# Patient Record
Sex: Female | Born: 1950
Health system: Southern US, Community
[De-identification: ages and names within clinical notes are randomized; demographics above are authoritative.]

## PROBLEM LIST (undated history)

## (undated) DIAGNOSIS — E119 Type 2 diabetes mellitus without complications: Secondary | ICD-10-CM

## (undated) DIAGNOSIS — R112 Nausea with vomiting, unspecified: Secondary | ICD-10-CM

## (undated) DIAGNOSIS — Z9889 Other specified postprocedural states: Secondary | ICD-10-CM

## (undated) DIAGNOSIS — M1990 Unspecified osteoarthritis, unspecified site: Secondary | ICD-10-CM

## (undated) HISTORY — DX: Type 2 diabetes mellitus without complications: E11.9

## (undated) HISTORY — PX: APPENDECTOMY: SHX54

## (undated) HISTORY — PX: TUBAL LIGATION: SHX77

## (undated) HISTORY — PX: LEEP: SHX91

## (undated) HISTORY — PX: OTHER SURGICAL HISTORY: SHX169

---

## 1993-03-28 HISTORY — PX: SPINE SURGERY: SHX786

## 2003-03-20 ENCOUNTER — Encounter (INDEPENDENT_AMBULATORY_CARE_PROVIDER_SITE_OTHER): Payer: Self-pay | Admitting: Specialist

## 2003-03-20 ENCOUNTER — Ambulatory Visit (HOSPITAL_COMMUNITY): Admission: RE | Admit: 2003-03-20 | Discharge: 2003-03-20 | Payer: Self-pay | Admitting: Gastroenterology

## 2005-01-26 ENCOUNTER — Other Ambulatory Visit: Admission: RE | Admit: 2005-01-26 | Discharge: 2005-01-26 | Payer: Self-pay | Admitting: Internal Medicine

## 2006-03-29 ENCOUNTER — Other Ambulatory Visit: Admission: RE | Admit: 2006-03-29 | Discharge: 2006-03-29 | Payer: Self-pay | Admitting: Internal Medicine

## 2007-03-19 ENCOUNTER — Other Ambulatory Visit: Admission: RE | Admit: 2007-03-19 | Discharge: 2007-03-19 | Payer: Self-pay | Admitting: Internal Medicine

## 2010-04-12 ENCOUNTER — Other Ambulatory Visit
Admission: RE | Admit: 2010-04-12 | Discharge: 2010-04-12 | Payer: Self-pay | Source: Home / Self Care | Admitting: Internal Medicine

## 2010-04-12 ENCOUNTER — Other Ambulatory Visit: Payer: Self-pay | Admitting: Internal Medicine

## 2010-08-13 NOTE — Op Note (Signed)
NAME:  Debra Norris, Debra Norris                       ACCOUNT NO.:  [id number]   MEDICAL RECORD NO.:  [id number]                   PATIENT TYPE:  AMB   LOCATION:  ENDO                                 FACILITY:  [facility]   PHYSICIAN:  Danise Edge, M.D.                DATE OF BIRTH:  1951-01-17   DATE OF PROCEDURE:  03/20/2003  DATE OF DISCHARGE:                                 OPERATIVE REPORT   PROCEDURE:  Colonoscopy and polypectomy.   PROCEDURE INDICATION:  Ms. Atarah Cadogan is a 60 year old female born  [birth year].  Ms. Larkin underwent her health maintenance flexible  proctosigmoidoscopy, and a sigmoid polyp was discovered by Theressa Millard,  M.D.   ENDOSCOPIST:  Danise Edge, M.D.   PREMEDICATION:  Versed 10 mg, fentanyl 100 mcg.   DESCRIPTION OF PROCEDURE:  After obtaining informed consent, Ms. Buehrer  was placed in the left lateral decubitus position.  I administered  intravenous fentanyl and intravenous Versed to achieve conscious sedation  for the procedure.  The patient's blood pressure, oxygen saturation, and  cardiac rhythm were monitored throughout the procedure and documented in the  medical record.   Anal inspection was normal.  Digital rectal exam was normal.  The Olympus  adjustable pediatric colonoscope was introduced into the rectum and with  some difficulty due to colonic loop formation, eventually advanced to the  cecum.  Colonic preparation for the exam today was excellent.   Rectum normal.   Sigmoid colon and descending colon:  At approximately 30-35 cm from the anal  verge, a 1 cm sessile polyp was removed with the electrocautery snare and  submitted for pathologic interpretation.   Splenic flexure normal.   Transverse colon normal.   Hepatic flexure normal.   Ascending colon normal.   Cecum and ileocecal valve:  A 1 mm sessile polyp was removed from the  proximal cecum with the cold biopsy forceps.   ASSESSMENT:  A diminutive polyp  was removed from the cecum with the cold  biopsy forceps and a 1 cm polyp was removed from the sigmoid colon at 30 cm  from the anal verge.  Both polyps were submitted in one bottle for  pathologic evaluation.   RECOMMENDATIONS:  Repeat colonoscopy in five years if polyp returns  neoplastic pathologically.                                               Danise Edge, M.D.    MJ/MEDQ  D:  03/20/2003  T:  03/21/2003  Job:  [id number]   cc:   Theressa Millard, M.D.  [address] [place]  [place] [address]  Fax: [phone]

## 2012-04-30 ENCOUNTER — Other Ambulatory Visit: Payer: Self-pay | Admitting: Internal Medicine

## 2012-04-30 ENCOUNTER — Other Ambulatory Visit (HOSPITAL_COMMUNITY)
Admission: RE | Admit: 2012-04-30 | Discharge: 2012-04-30 | Disposition: A | Discharge status: Home / Self Care | Payer: Managed Care, Other (non HMO) | Source: Ambulatory Visit | Attending: Internal Medicine | Admitting: Internal Medicine

## 2012-04-30 DIAGNOSIS — Z01419 Encounter for gynecological examination (general) (routine) without abnormal findings: Secondary | ICD-10-CM | POA: Insufficient documentation

## 2013-05-28 ENCOUNTER — Encounter: Payer: BC Managed Care – PPO | Attending: Nurse Practitioner

## 2013-05-28 VITALS — Ht 66.0 in | Wt 188.0 lb

## 2013-05-28 DIAGNOSIS — E119 Type 2 diabetes mellitus without complications: Secondary | ICD-10-CM | POA: Insufficient documentation

## 2013-05-28 DIAGNOSIS — Z713 Dietary counseling and surveillance: Secondary | ICD-10-CM | POA: Insufficient documentation

## 2013-06-03 NOTE — Progress Notes (Signed)
Patient was seen on 05/28/13 for the first of a series of three diabetes self-management courses at the [facility].  Current HbA1c: 12.7%  The following learning objectives were met by the patient during this class:  Describe diabetes  State some common risk factors for diabetes  Defines the role of glucose and insulin  Identifies type of diabetes and pathophysiology  Describe the relationship between diabetes and cardiovascular risk  State the members of the Healthcare Team  States the rationale for glucose monitoring  State when to test glucose  State their individual Target Range  State the importance of logging glucose readings  Describe how to interpret glucose readings  Identifies A1C target  Explain the correlation between A1c and eAG values  State symptoms and treatment of high blood glucose  State symptoms and treatment of low blood glucose  Explain proper technique for glucose testing  Identifies proper sharps disposal  Handouts given during class include:  Living Well with Diabetes book  Carb Counting and Meal Planning book  Meal Plan Card  Carbohydrate guide  Meal planning worksheet  Low Sodium Flavoring Tips  The diabetes portion plate  T6K to eAG Conversion Chart  Diabetes Medications  Diabetes Recommended Care Schedule  Support Group  Diabetes Success Plan  Core Class Satisfaction Survey  Follow-Up Plan:  Attend core 2

## 2013-06-11 ENCOUNTER — Ambulatory Visit: Payer: Managed Care, Other (non HMO)

## 2013-06-12 DIAGNOSIS — E119 Type 2 diabetes mellitus without complications: Secondary | ICD-10-CM

## 2013-06-14 NOTE — Progress Notes (Signed)

## 2013-06-19 DIAGNOSIS — E119 Type 2 diabetes mellitus without complications: Secondary | ICD-10-CM

## 2013-06-19 NOTE — Progress Notes (Signed)
Patient was seen on 06/19/2013 for the third of a series of three diabetes self-management courses at the [facility]. The following learning objectives were met by the patient during this class:    State the amount of activity recommended for healthy living   Describe activities suitable for individual needs   Identify ways to regularly incorporate activity into daily life   Identify barriers to activity and ways to over come these barriers  Identify diabetes medications being personally used and their primary action for lowering glucose and possible side effects   Describe role of stress on blood glucose and develop strategies to address psychosocial issues   Identify diabetes complications and ways to prevent them  Explain how to manage diabetes during illness   Evaluate success in meeting personal goal   Establish 2-3 goals that they will plan to diligently work on until they return for the  33-month follow-up visit  Goals:  Follow Diabetes Meal Plan as instructed  Aim for 15-30 mins of physical activity daily as tolerated  Bring food record and glucose log to your follow up visit  Your patient has established the following 4 month goals in their individualized success plan:  Increase my activity by at least 5 days a week  Be active 15 minutes or more 3 x week  Test glucose 2 x day  Your patient has identified these potential barriers to change:  Days just don't feel like it  Feel tired   Your patient has identified their diabetes self-care support plan as  [facility]

## 2013-09-30 ENCOUNTER — Encounter: Payer: BC Managed Care – PPO | Attending: Internal Medicine

## 2013-09-30 DIAGNOSIS — E118 Type 2 diabetes mellitus with unspecified complications: Secondary | ICD-10-CM | POA: Insufficient documentation

## 2013-09-30 DIAGNOSIS — Z713 Dietary counseling and surveillance: Secondary | ICD-10-CM | POA: Diagnosis not present

## 2013-09-30 NOTE — Progress Notes (Signed)
Patient was seen on 09/30/2013 for a review of the series of three diabetes self-management courses at the [facility]. The following learning objectives were met by the patient during this class:    Reviewed blood glucose monitoring and interpretation including the recommended target ranges and Hgb A1c.    Reviewed on carb counting, importance of regularly scheduled meals/snacks, and meal planning.    Reviewed the effects of physical activity on glucose levels and long-term glucose control.  Recommended goal of 150 minutes of physical activity/week.   Reviewed patient medications and discussed role of medication on blood glucose and possible side effects.   Discussed strategies to manage stress, psychosocial issues, and other obstacles to diabetes management.   Encouraged moderate weight reduction to improve glucose levels.     Reviewed short-term complications: hyper- and hypo-glycemia.  Discussed causes, symptoms, and treatment options.   Reviewed prevention, detection, and treatment of long-term complications.  Discussed the role of prolonged elevated glucose levels on body systems.  Goals:  Follow Diabetes Meal Plan as instructed  Eat 3 meals and 2 snacks, every 3-5 hrs  Limit carbohydrate intake to 45 grams carbohydrate/meal Limit carbohydrate intake to 15 grams carbohydrate/snack Add lean protein foods to meals/snacks  Monitor glucose levels as instructed by your doctor  Aim for goal of 15-30 mins of physical activity daily as tolerated  Bring food record and glucose log to your next nutrition visit   

## 2015-05-11 ENCOUNTER — Other Ambulatory Visit (HOSPITAL_COMMUNITY)
Admission: RE | Admit: 2015-05-11 | Discharge: 2015-05-11 | Disposition: A | Discharge status: Home / Self Care | Payer: BLUE CROSS/BLUE SHIELD | Source: Ambulatory Visit | Attending: Internal Medicine | Admitting: Internal Medicine

## 2015-05-11 ENCOUNTER — Other Ambulatory Visit: Payer: Self-pay | Admitting: Internal Medicine

## 2015-05-11 DIAGNOSIS — Z1389 Encounter for screening for other disorder: Secondary | ICD-10-CM | POA: Diagnosis not present

## 2015-05-11 DIAGNOSIS — E1142 Type 2 diabetes mellitus with diabetic polyneuropathy: Secondary | ICD-10-CM | POA: Diagnosis not present

## 2015-05-11 DIAGNOSIS — Z6832 Body mass index (BMI) 32.0-32.9, adult: Secondary | ICD-10-CM | POA: Diagnosis not present

## 2015-05-11 DIAGNOSIS — Z1151 Encounter for screening for human papillomavirus (HPV): Secondary | ICD-10-CM | POA: Diagnosis present

## 2015-05-11 DIAGNOSIS — Z0000 Encounter for general adult medical examination without abnormal findings: Secondary | ICD-10-CM | POA: Diagnosis not present

## 2015-05-11 DIAGNOSIS — Z124 Encounter for screening for malignant neoplasm of cervix: Secondary | ICD-10-CM | POA: Diagnosis not present

## 2015-05-11 DIAGNOSIS — Z01419 Encounter for gynecological examination (general) (routine) without abnormal findings: Secondary | ICD-10-CM | POA: Diagnosis present

## 2015-05-11 DIAGNOSIS — E785 Hyperlipidemia, unspecified: Secondary | ICD-10-CM | POA: Diagnosis not present

## 2015-05-11 DIAGNOSIS — R32 Unspecified urinary incontinence: Secondary | ICD-10-CM | POA: Diagnosis not present

## 2015-05-13 LAB — CYTOLOGY - PAP

## 2016-05-16 ENCOUNTER — Other Ambulatory Visit: Payer: Self-pay | Admitting: Internal Medicine

## 2016-05-16 DIAGNOSIS — R1030 Lower abdominal pain, unspecified: Secondary | ICD-10-CM

## 2016-05-16 DIAGNOSIS — R102 Pelvic and perineal pain: Secondary | ICD-10-CM

## 2016-05-20 ENCOUNTER — Ambulatory Visit
Admission: RE | Admit: 2016-05-20 | Discharge: 2016-05-20 | Disposition: A | Discharge status: Home / Self Care | Payer: BLUE CROSS/BLUE SHIELD | Source: Ambulatory Visit | Attending: Internal Medicine | Admitting: Internal Medicine

## 2016-05-20 DIAGNOSIS — R1030 Lower abdominal pain, unspecified: Secondary | ICD-10-CM

## 2016-05-20 DIAGNOSIS — R102 Pelvic and perineal pain: Secondary | ICD-10-CM

## 2016-07-18 ENCOUNTER — Other Ambulatory Visit: Payer: Self-pay | Admitting: Gastroenterology

## 2016-08-15 ENCOUNTER — Encounter (HOSPITAL_COMMUNITY): Payer: Self-pay | Admitting: *Deleted

## 2016-08-17 ENCOUNTER — Encounter (HOSPITAL_COMMUNITY): Payer: Self-pay | Admitting: *Deleted

## 2016-08-17 ENCOUNTER — Ambulatory Visit (HOSPITAL_COMMUNITY): Payer: Medicare Other | Admitting: Anesthesiology

## 2016-08-17 ENCOUNTER — Encounter (HOSPITAL_COMMUNITY)
Admission: RE | Disposition: A | Discharge status: Home / Self Care | Payer: Self-pay | Source: Ambulatory Visit | Attending: Gastroenterology

## 2016-08-17 ENCOUNTER — Ambulatory Visit (HOSPITAL_COMMUNITY)
Admission: RE | Admit: 2016-08-17 | Discharge: 2016-08-17 | Disposition: A | Discharge status: Home / Self Care | Payer: Medicare Other | Source: Ambulatory Visit | Attending: Gastroenterology | Admitting: Gastroenterology

## 2016-08-17 DIAGNOSIS — Z800 Family history of malignant neoplasm of digestive organs: Secondary | ICD-10-CM | POA: Diagnosis not present

## 2016-08-17 DIAGNOSIS — Z8371 Family history of colonic polyps: Secondary | ICD-10-CM | POA: Insufficient documentation

## 2016-08-17 DIAGNOSIS — Z87891 Personal history of nicotine dependence: Secondary | ICD-10-CM | POA: Insufficient documentation

## 2016-08-17 DIAGNOSIS — Z1211 Encounter for screening for malignant neoplasm of colon: Secondary | ICD-10-CM | POA: Insufficient documentation

## 2016-08-17 DIAGNOSIS — Z79899 Other long term (current) drug therapy: Secondary | ICD-10-CM | POA: Diagnosis not present

## 2016-08-17 DIAGNOSIS — Z86010 Personal history of colonic polyps: Secondary | ICD-10-CM | POA: Diagnosis not present

## 2016-08-17 DIAGNOSIS — E1142 Type 2 diabetes mellitus with diabetic polyneuropathy: Secondary | ICD-10-CM | POA: Diagnosis not present

## 2016-08-17 DIAGNOSIS — Z791 Long term (current) use of non-steroidal anti-inflammatories (NSAID): Secondary | ICD-10-CM | POA: Insufficient documentation

## 2016-08-17 DIAGNOSIS — D120 Benign neoplasm of cecum: Secondary | ICD-10-CM | POA: Insufficient documentation

## 2016-08-17 HISTORY — DX: Nausea with vomiting, unspecified: R11.2

## 2016-08-17 HISTORY — PX: COLONOSCOPY WITH PROPOFOL: SHX5780

## 2016-08-17 HISTORY — DX: Other specified postprocedural states: Z98.890

## 2016-08-17 HISTORY — DX: Unspecified osteoarthritis, unspecified site: M19.90

## 2016-08-17 LAB — GLUCOSE, CAPILLARY: GLUCOSE-CAPILLARY: 112 mg/dL — AB (ref 65–99)

## 2016-08-17 SURGERY — COLONOSCOPY WITH PROPOFOL
Anesthesia: Monitor Anesthesia Care

## 2016-08-17 MED ORDER — PROPOFOL 10 MG/ML IV BOLUS
INTRAVENOUS | Status: DC | PRN
  Administered 2016-08-17: 40 mg via INTRAVENOUS
  Administered 2016-08-17: 20 mg via INTRAVENOUS

## 2016-08-17 MED ORDER — LIDOCAINE 2% (20 MG/ML) 5 ML SYRINGE
INTRAMUSCULAR | Status: AC
  Filled 2016-08-17: qty 5

## 2016-08-17 MED ORDER — PROPOFOL 10 MG/ML IV BOLUS
INTRAVENOUS | Status: AC
  Filled 2016-08-17: qty 60

## 2016-08-17 MED ORDER — LIDOCAINE 2% (20 MG/ML) 5 ML SYRINGE
INTRAMUSCULAR | Status: DC | PRN
  Administered 2016-08-17: 80 mg via INTRAVENOUS

## 2016-08-17 MED ORDER — PROPOFOL 500 MG/50ML IV EMUL
INTRAVENOUS | Status: DC | PRN
  Administered 2016-08-17: 75 ug/kg/min via INTRAVENOUS

## 2016-08-17 MED ORDER — SODIUM CHLORIDE 0.9 % IV SOLN: INTRAVENOUS | Status: DC

## 2016-08-17 MED ORDER — ONDANSETRON HCL 4 MG/2ML IJ SOLN
INTRAMUSCULAR | Status: AC
  Filled 2016-08-17: qty 2

## 2016-08-17 MED ORDER — ONDANSETRON HCL 4 MG/2ML IJ SOLN
INTRAMUSCULAR | Status: DC | PRN
  Administered 2016-08-17: 4 mg via INTRAVENOUS

## 2016-08-17 MED ORDER — LACTATED RINGERS IV SOLN
INTRAVENOUS | Status: DC
  Administered 2016-08-17: 09:00:00 via INTRAVENOUS

## 2016-08-17 SURGICAL SUPPLY — 21 items

## 2016-08-17 NOTE — Op Note (Signed)
[facility] Patient Name: Debra Norris Procedure Date: 08/17/2016 MRN: [id number] Attending MD: Garlan Fair , MD Date of Birth: [birth year] CSN: [id number] Age: 66 Admit Type: Outpatient Procedure:                Colonoscopy Indications:              High risk colon cancer surveillance: 03/20/2003                            screening colonoscopy was performed with removal of                            a diminutive adenomatous cecal polyp and diminutive                            adenomatous sigmoid colon polyp. Normal                            surveillance colonoscopies were performed in                            January 2008 and March 2013. Providers:                Garlan Fair, MD, Carmie End, RN, Lillie Fragmin, RN, Tinnie Gens, Technician Referring MD:              Medicines:                Propofol per Anesthesia Complications:            No immediate complications. Estimated Blood Loss:     Estimated blood loss was minimal. Procedure:                Pre-Anesthesia Assessment:                           - Prior to the procedure, a History and Physical                            was performed, and patient medications and                            allergies were reviewed. The patient's tolerance of                            previous anesthesia was also reviewed. The risks                            and benefits of the procedure and the sedation                            options and risks were discussed with the patient.  All questions were answered, and informed consent                            was obtained. Prior Anticoagulants: The patient has                            taken no previous anticoagulant or antiplatelet                            agents. ASA Grade Assessment: II - A patient with                            mild systemic disease. After reviewing the risks          and benefits, the patient was deemed in                            satisfactory condition to undergo the procedure.                           After obtaining informed consent, the colonoscope                            was passed under direct vision. Throughout the                            procedure, the patient's blood pressure, pulse, and                            oxygen saturations were monitored continuously. The                            EC-[id number] ([id number]) scope was introduced through                            the anus and advanced to the the cecum, identified                            by appendiceal orifice and ileocecal valve. The                            colonoscopy was somewhat difficult due to                            significant looping. The patient tolerated the                            procedure well. The quality of the bowel                            preparation was good. The ileocecal valve, the                            appendiceal orifice and the rectum were  photographed. Scope In: 9:52:42 AM Scope Out: 10:19:39 AM Scope Withdrawal Time: 0 hours 15 minutes 8 seconds  Total Procedure Duration: 0 hours 26 minutes 57 seconds  Findings:      The perianal and digital rectal examinations were normal.      A 7 mm polyp was found in the cecum. The polyp was sessile. The polyp       was removed with a piecemeal technique using a cold snare. Resection and       retrieval were complete.      The exam was otherwise without abnormality. Impression:               - One 7 mm polyp in the cecum, removed piecemeal                            using a cold snare. Resected and retrieved.                           - The examination was otherwise normal. Moderate Sedation:      N/A- Per Anesthesia Care Recommendation:           - Patient has a contact number available for                            emergencies. The signs and symptoms of  potential                            delayed complications were discussed with the                            patient. Return to normal activities tomorrow.                            Written discharge instructions were provided to the                            patient.                           - Repeat colonoscopy in 5 years for surveillance.                           - Resume previous diet.                           - Continue present medications. Procedure Code(s):        --- Professional ---                           [phone], Colonoscopy, flexible; with removal of                            tumor(s), polyp(s), or other lesion(s) by snare                            technique Diagnosis Code(s):        --- Professional ---  Z86.010, Personal history of colonic polyps                           D12.0, Benign neoplasm of cecum CPT copyright 2016 [employer]. All rights reserved. The codes documented in this report are preliminary and upon coder review may  be revised to meet current compliance requirements. Earle Gell, MD Garlan Fair, MD 08/17/2016 10:26:11 AM This report has been signed electronically. Number of Addenda: 0

## 2016-08-17 NOTE — Transfer of Care (Signed)
Immediate Anesthesia Transfer of Care Note  Patient: Debra Norris  Procedure(s) Performed: Procedure(s): COLONOSCOPY WITH PROPOFOL (N/A)  Patient Location: PACU  Anesthesia Type:MAC  Level of Consciousness:  sedated, patient cooperative and responds to stimulation  Airway & Oxygen Therapy:Patient Spontanous Breathing and Patient connected to face mask oxgen  Post-op Assessment:  Report given to PACU RN and Post -op Vital signs reviewed and stable  Post vital signs:  Reviewed and stable  Last Vitals:  Vitals:   08/17/16 0855  BP: (!) 146/79  Pulse: 66  Resp: 11  Temp: 09.3 C    Complications: No apparent anesthesia complications

## 2016-08-17 NOTE — Anesthesia Postprocedure Evaluation (Signed)
Anesthesia Post Note  Patient: Debra Norris  Procedure(s) Performed: Procedure(s) (LRB): COLONOSCOPY WITH PROPOFOL (N/A)  Patient location during evaluation: PACU Anesthesia Type: MAC Level of consciousness: awake and alert Pain management: pain level controlled Vital Signs Assessment: post-procedure vital signs reviewed and stable Respiratory status: spontaneous breathing, nonlabored ventilation, respiratory function stable and patient connected to nasal cannula oxygen Cardiovascular status: stable and blood pressure returned to baseline Anesthetic complications: no       Last Vitals:  Vitals:   08/17/16 1030 08/17/16 1040  BP: 107/65 107/64  Pulse: 62 (!) 59  Resp: 14 13  Temp:      Last Pain:  Vitals:   08/17/16 1025  TempSrc: Oral                 Haden Suder S

## 2016-08-17 NOTE — Anesthesia Preprocedure Evaluation (Signed)
Anesthesia Evaluation  Patient identified by MRN, date of birth, ID band Patient awake    Reviewed: Allergy & Precautions, NPO status , Patient's Chart, lab work & pertinent test results  Airway Mallampati: II  TM Distance: >3 FB Neck ROM: Full    Dental no notable dental hx.    Pulmonary neg pulmonary ROS, former smoker,    Pulmonary exam normal breath sounds clear to auscultation       Cardiovascular negative cardio ROS Normal cardiovascular exam Rhythm:Regular Rate:Normal     Neuro/Psych negative neurological ROS  negative psych ROS   GI/Hepatic negative GI ROS, Neg liver ROS,   Endo/Other  negative endocrine ROSdiabetes  Renal/GU negative Renal ROS  negative genitourinary   Musculoskeletal negative musculoskeletal ROS (+)   Abdominal   Peds negative pediatric ROS (+)  Hematology negative hematology ROS (+)   Anesthesia Other Findings   Reproductive/Obstetrics negative OB ROS                             Anesthesia Physical Anesthesia Plan  ASA: II  Anesthesia Plan: MAC   Post-op Pain Management:    Induction: Intravenous  Airway Management Planned: Nasal Cannula  Additional Equipment:   Intra-op Plan:   Post-operative Plan:   Informed Consent: I have reviewed the patients History and Physical, chart, labs and discussed the procedure including the risks, benefits and alternatives for the proposed anesthesia with the patient or authorized representative who has indicated his/her understanding and acceptance.   Dental advisory given  Plan Discussed with: CRNA and Surgeon  Anesthesia Plan Comments:         Anesthesia Quick Evaluation

## 2016-08-17 NOTE — Discharge Instructions (Signed)

## 2016-08-17 NOTE — H&P (Signed)
Procedure: Surveillance colonoscopy. Normal surveillance colonoscopies were performed in March 2013 and January 2008. 03/20/2003 screening colonoscopy was performed with removal of a diminutive adenomatous cecal polyp and diminutive adenomatous sigmoid colon polyp. A brother required surgery to remove a colon polyp recently. Another brother was diagnosed with pancreatic cancer.  History: The patient is a 66 year old female born 02/12/1951. She is scheduled to undergo a repeat surveillance colonoscopy today.  Past medical history: Type 2 diabetes mellitus was diagnosed in 2014. Mild diabetic peripheral neuropathy. Stress urinary incontinence. Lumbar radiculopathy. Osteoarthritis of the hip. Tubal ligation. Lumbar laminectomy. Uterine fibroid tumor removed surgically.  Medication allergies: Demerol. Erythromycin.Xartemis.  Exam: The patient is alert and lying comfortably on the endoscopy stretcher. Abdomen soft and nontender to palpation. Lungs are clear to auscultation. Cardiac exam reveals a regular rhythm.  Plan: Proceed with surveillance colonoscopy.

## 2016-08-18 ENCOUNTER — Encounter (HOSPITAL_COMMUNITY): Payer: Self-pay | Admitting: Gastroenterology

## 2016-08-29 NOTE — Addendum Note (Signed)
Addendum  created 08/29/16 1447 by Myrtie Soman, MD   Sign clinical note

## 2016-08-29 NOTE — Anesthesia Postprocedure Evaluation (Signed)
Anesthesia Post Note  Patient: Debra Norris  Procedure(s) Performed: Procedure(s) (LRB): COLONOSCOPY WITH PROPOFOL (N/A)     Anesthesia Post Evaluation  Last Vitals:  Vitals:   08/17/16 1030 08/17/16 1040  BP: 107/65 107/64  Pulse: 62 (!) 59  Resp: 14 13  Temp:      Last Pain:  Vitals:   08/17/16 1025  TempSrc: Oral                 Zaiden Ludlum S

## 2016-11-15 DIAGNOSIS — H2513 Age-related nuclear cataract, bilateral: Secondary | ICD-10-CM | POA: Diagnosis not present

## 2016-11-15 DIAGNOSIS — E119 Type 2 diabetes mellitus without complications: Secondary | ICD-10-CM | POA: Diagnosis not present

## 2016-11-15 DIAGNOSIS — H35363 Drusen (degenerative) of macula, bilateral: Secondary | ICD-10-CM | POA: Diagnosis not present

## 2016-12-08 DIAGNOSIS — Z1231 Encounter for screening mammogram for malignant neoplasm of breast: Secondary | ICD-10-CM | POA: Diagnosis not present

## 2017-01-12 DIAGNOSIS — M25561 Pain in right knee: Secondary | ICD-10-CM | POA: Diagnosis not present

## 2017-01-12 DIAGNOSIS — E119 Type 2 diabetes mellitus without complications: Secondary | ICD-10-CM | POA: Insufficient documentation

## 2017-01-12 DIAGNOSIS — E7800 Pure hypercholesterolemia, unspecified: Secondary | ICD-10-CM | POA: Insufficient documentation

## 2017-01-24 DIAGNOSIS — M25561 Pain in right knee: Secondary | ICD-10-CM | POA: Diagnosis not present

## 2017-01-24 DIAGNOSIS — M1711 Unilateral primary osteoarthritis, right knee: Secondary | ICD-10-CM | POA: Diagnosis not present

## 2017-02-25 IMAGING — US US ABDOMEN COMPLETE
1 series · 14 of 25 positions shown · non-contrast
Comparison: None.

CLINICAL DATA: Lower abdominal pain, family history of pancreatic
cancer

EXAM:
ABDOMEN ULTRASOUND COMPLETE

[Series 1: us abdomen complete · 0.23mm/px · 14 of 90 slices shown]
[im 1/90]
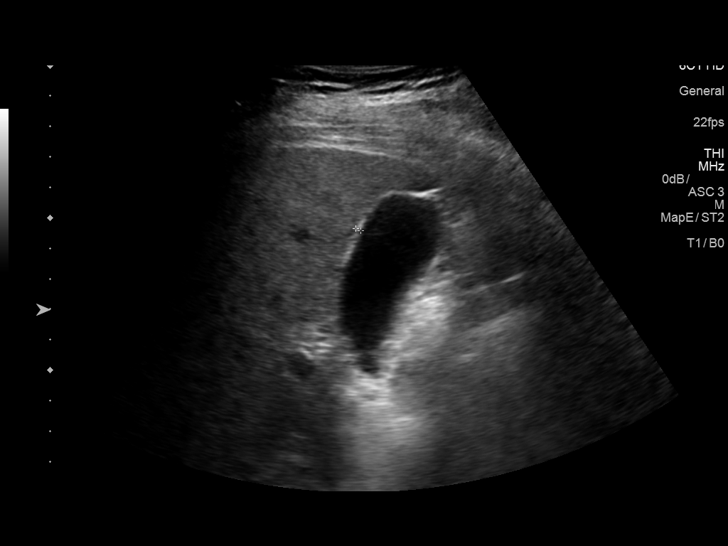
[im 8/90]
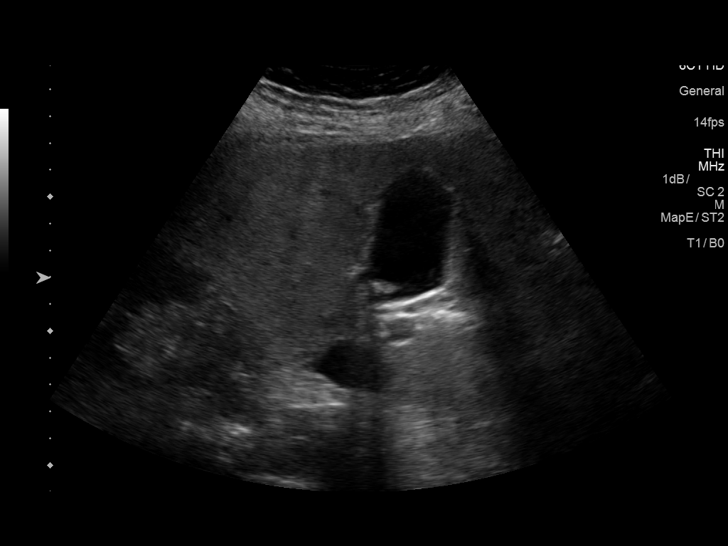
[im 15/90]
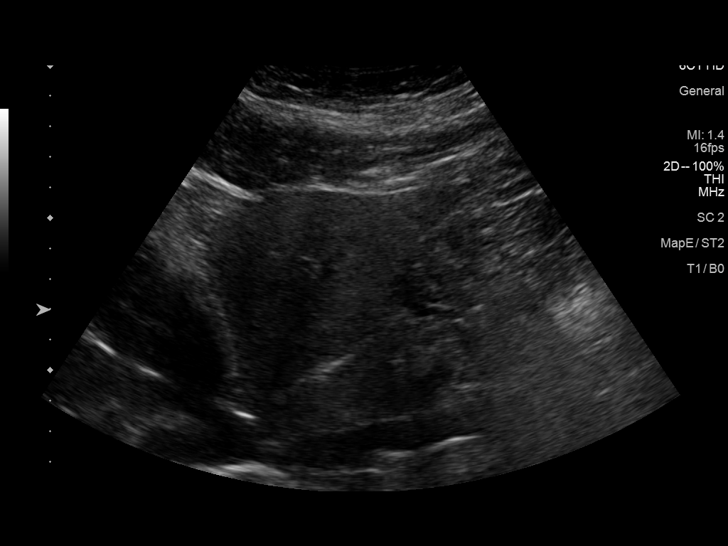
[im 23/90]
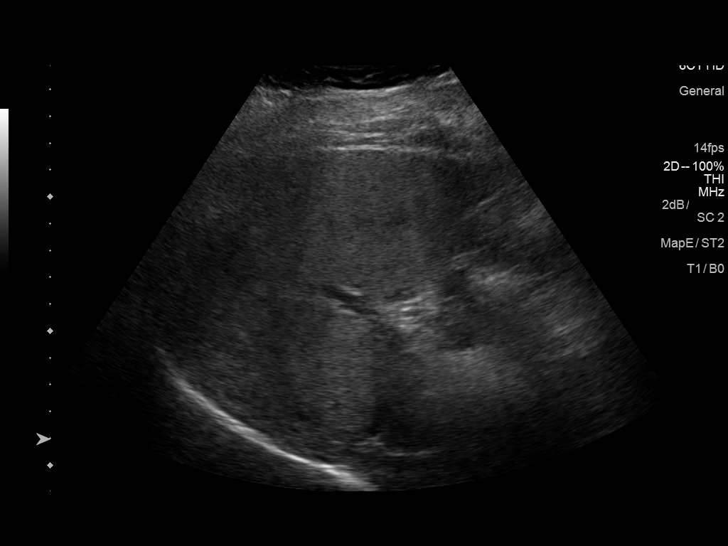
[im 30/90]
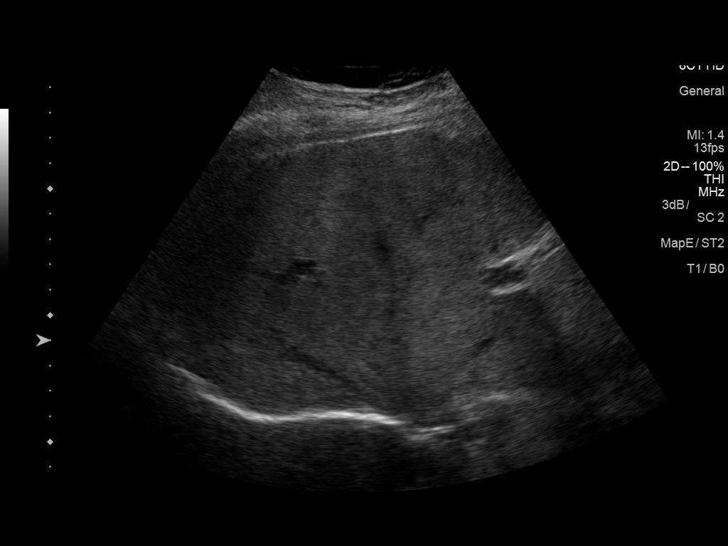
[im 34/90]
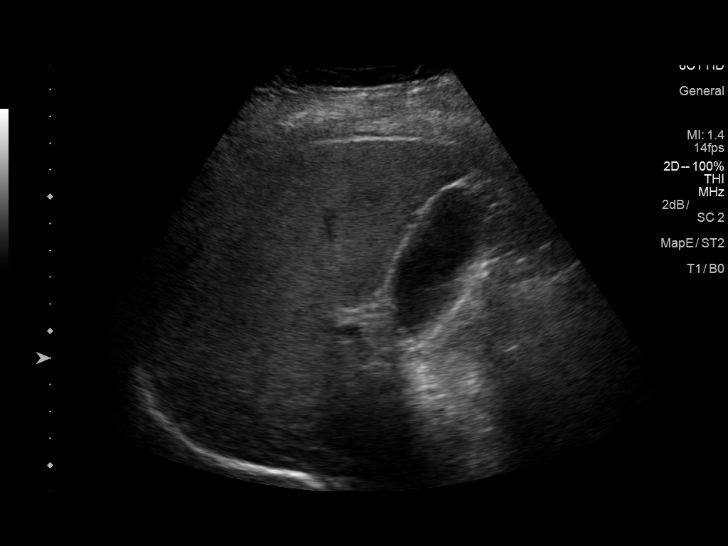
[im 41/90]
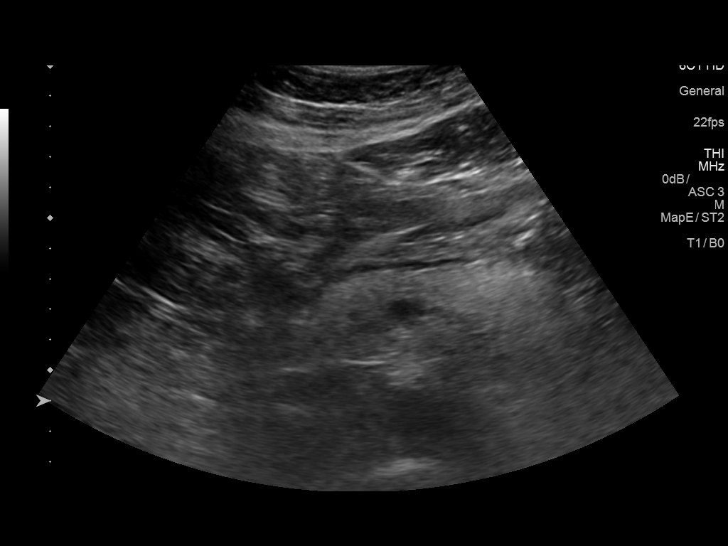
[im 49/90]
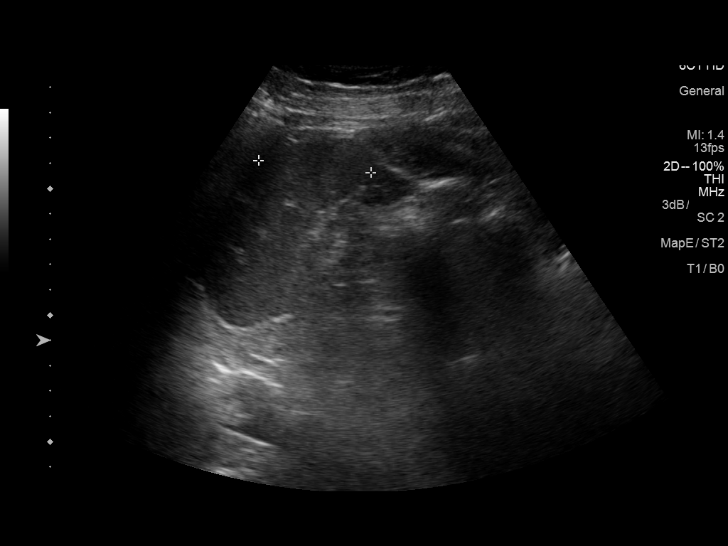
[im 56/90]
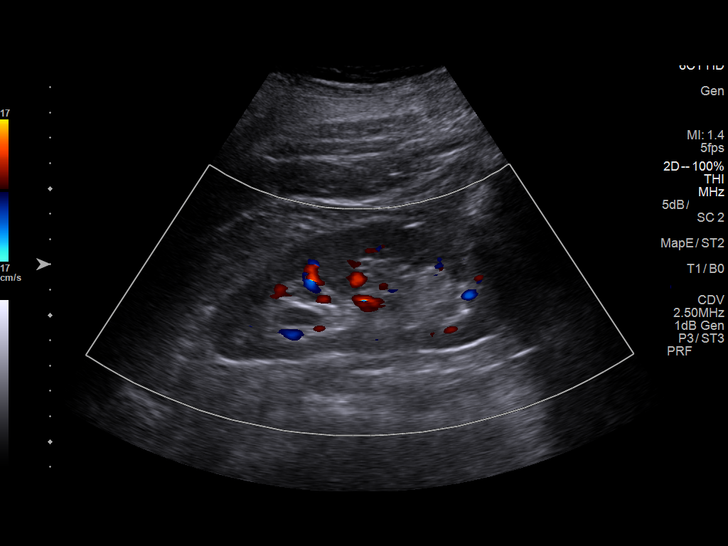
[im 60/90]
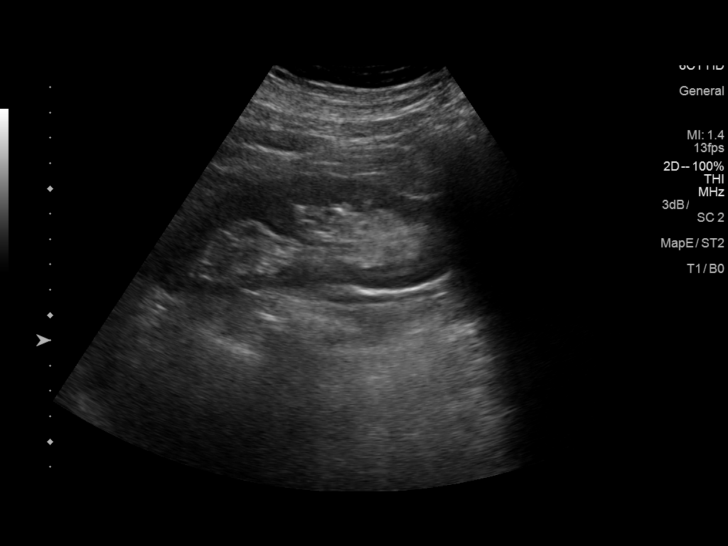
[im 67/90]
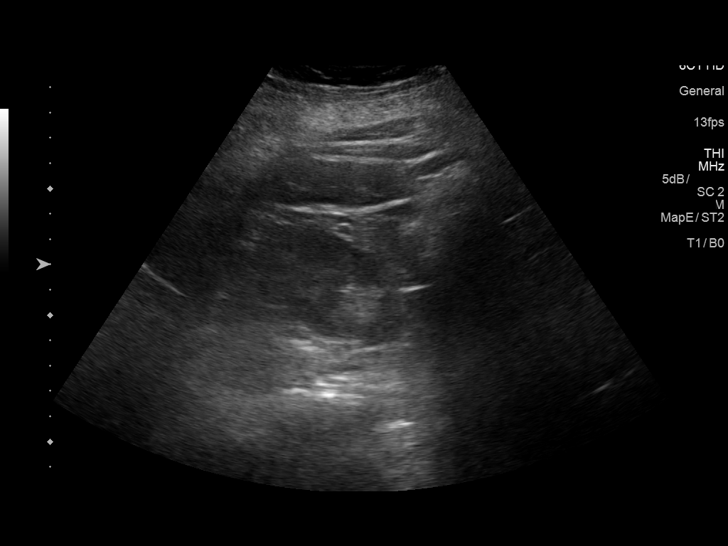
[im 75/90]
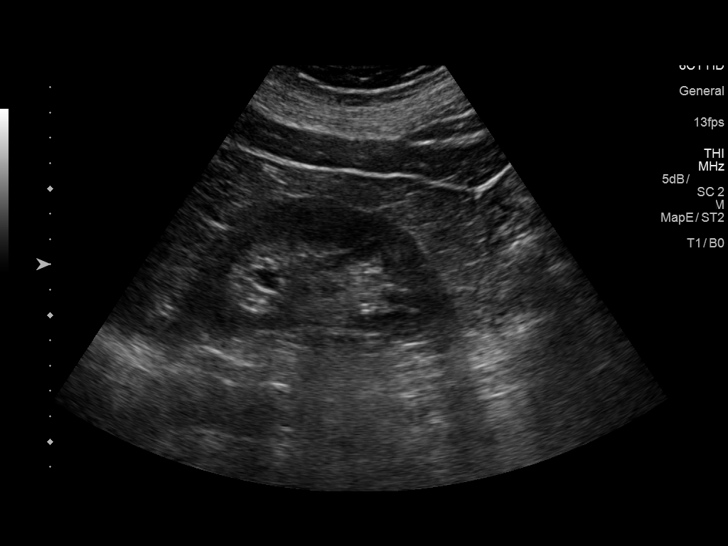
[im 82/90]
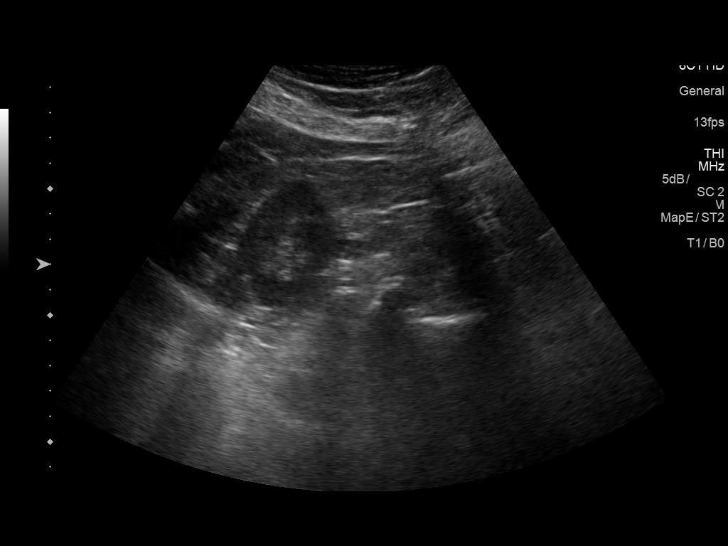
[im 90/90]
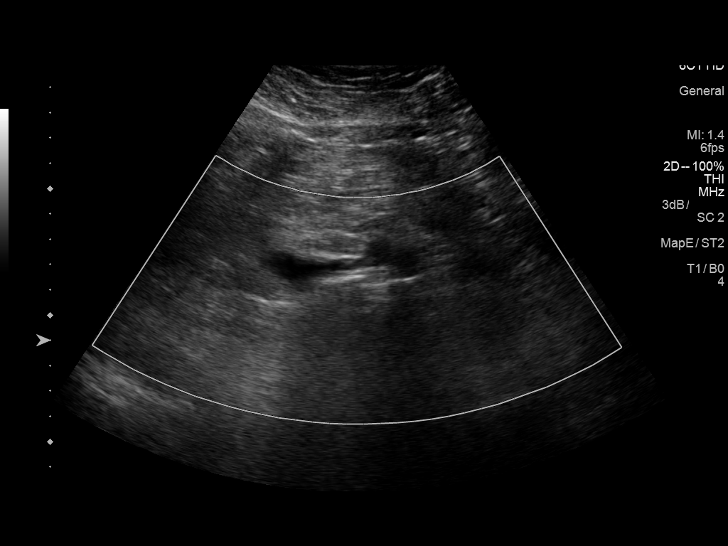

[14 of 25 positions shown; findings below may reference images not displayed]

FINDINGS: Gallbladder: No gallstones or wall thickening visualized. No
sonographic Murphy sign noted by sonographer.

Common bile duct: Diameter: 3.7 mm in diameter within normal limits

Liver: No focal lesion identified. Within normal limits in
parenchymal echogenicity.

IVC: No abnormality visualized.

Pancreas: Visualized portion unremarkable.

Spleen: Size and appearance within normal limits. Measures 4.5 cm in
length

Right Kidney: Length: 12 cm. Echogenicity within normal limits. No
mass or hydronephrosis visualized.

Left Kidney: Length: 11 cm. Echogenicity within normal limits. No
mass or hydronephrosis visualized.

Abdominal aorta: No aneurysm visualized. Measures up to 2.3 cm in
diameter.

Other findings: None.
IMPRESSION: 1. No gallstones are noted within gallbladder.  Normal CBD.
2. Normal liver echogenicity. No focal hepatic mass. Unremarkable
visualized pancreas.
3. No hydronephrosis or renal calculi.
4. No aortic aneurysm.

## 2017-02-25 IMAGING — US US PELVIS COMPLETE
1 series · 14 of 25 positions shown · non-contrast
Comparison: 05/20/2016.

CLINICAL DATA: Pelvic pain .

EXAM:
TRANSABDOMINAL AND TRANSVAGINAL ULTRASOUND OF PELVIS
TECHNIQUE: Both transabdominal and transvaginal ultrasound examinations of the
pelvis were performed. Transabdominal technique was performed for
global imaging of the pelvis including uterus, ovaries, adnexal
regions, and pelvic cul-de-sac. It was necessary to proceed with
endovaginal exam following the transabdominal exam to visualize the
uterus and ovaries..

[Series 1: us pelvis complete · 0.27mm/px · 14 of 50 slices shown]
[im 1/50]
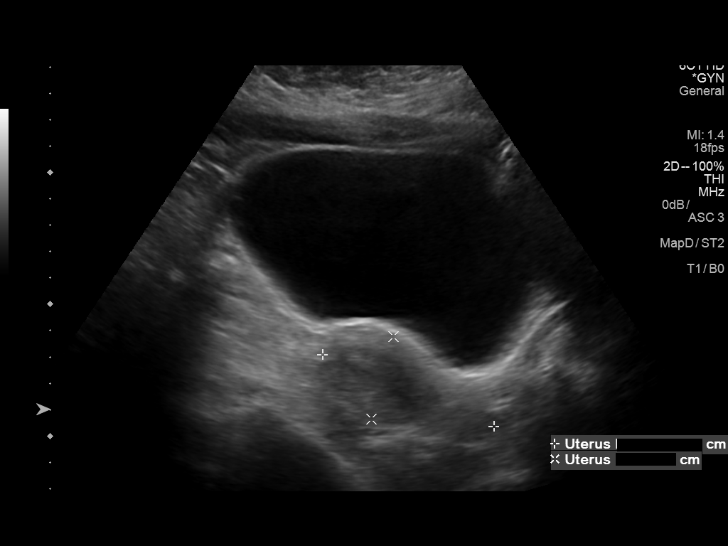
[im 5/50]
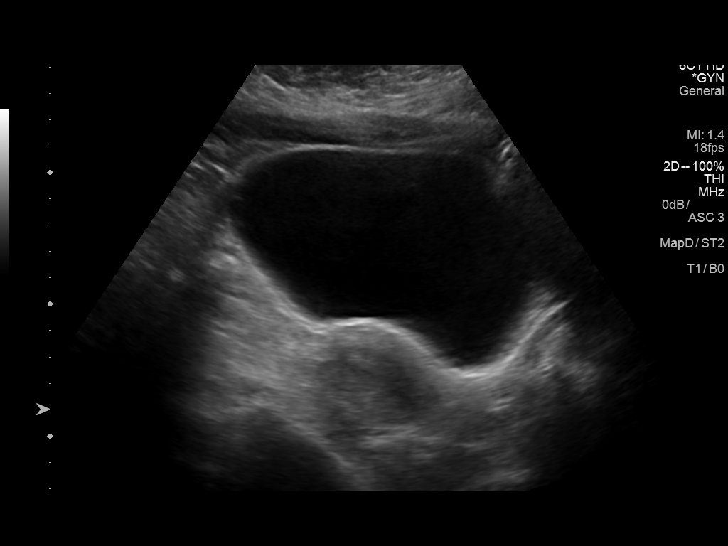
[im 9/50]
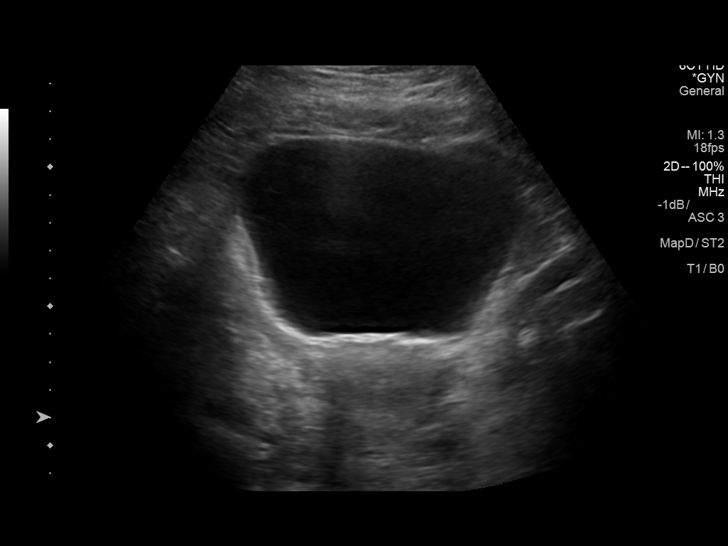
[im 13/50]
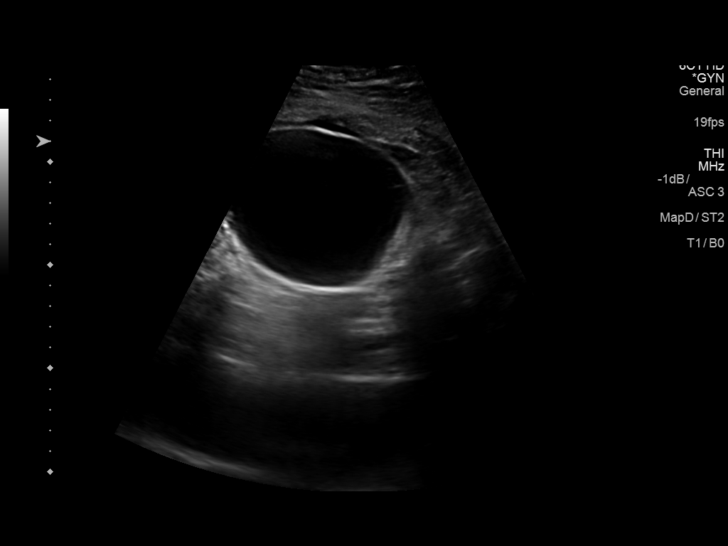
[im 17/50]
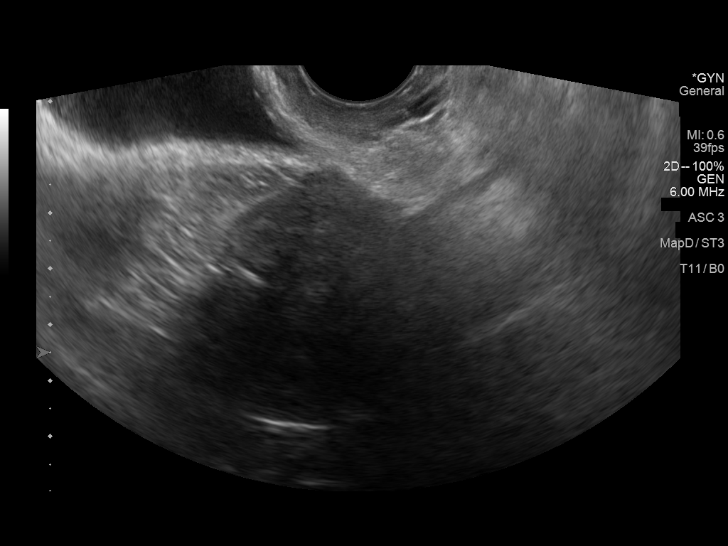
[im 19/50]
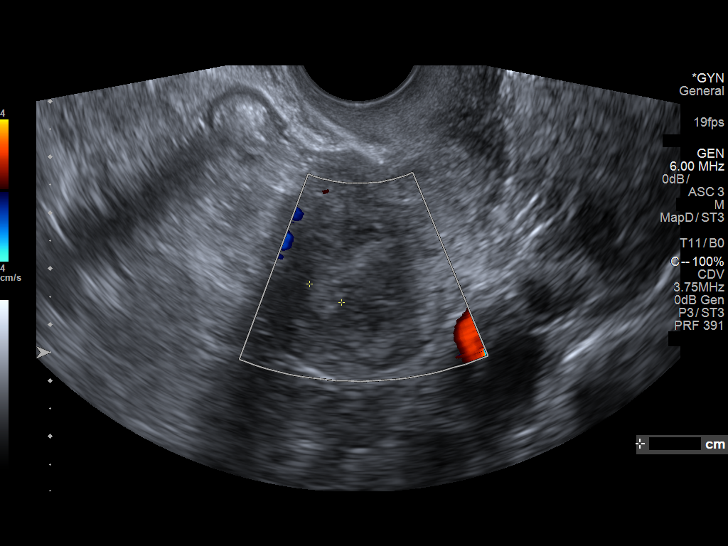
[im 23/50]
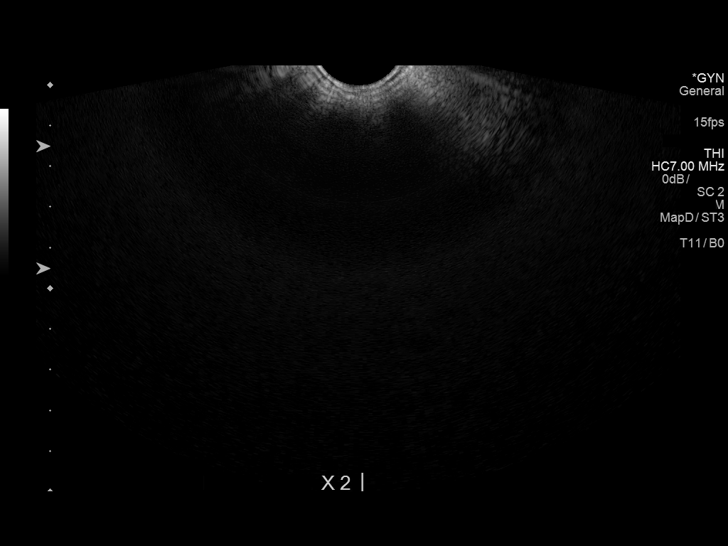
[im 27/50]
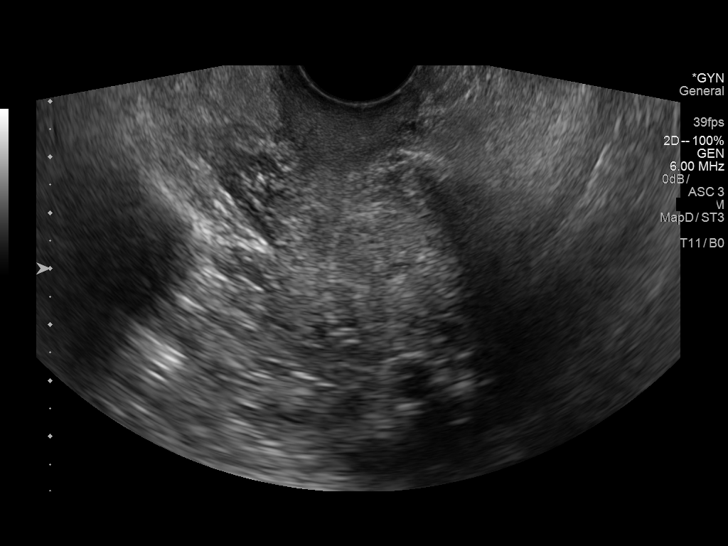
[im 31/50]
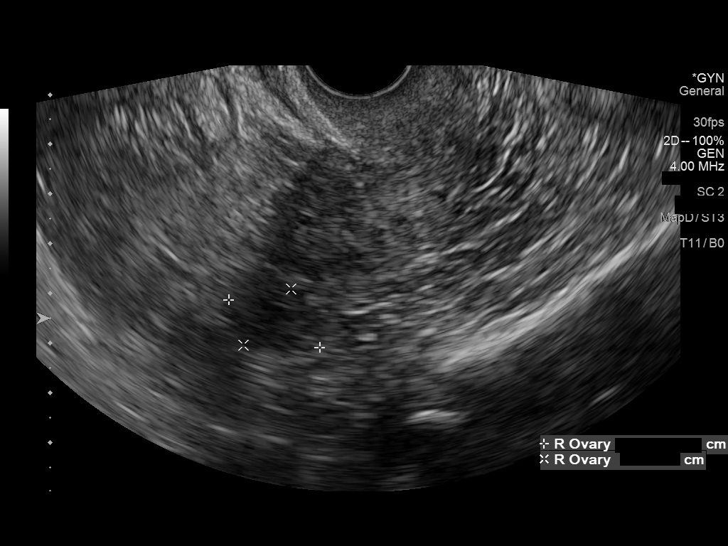
[im 33/50]
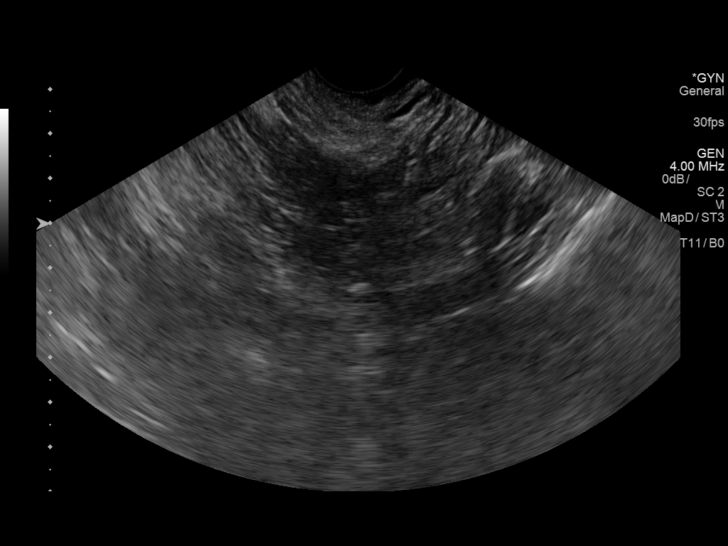
[im 37/50]
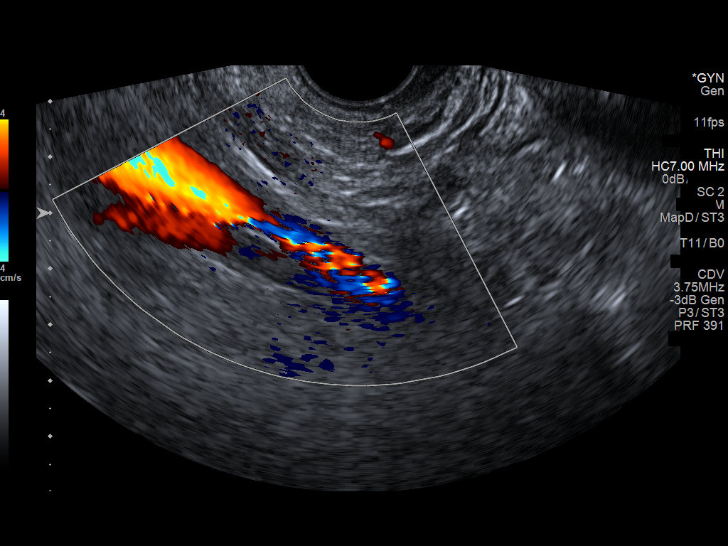
[im 41/50]
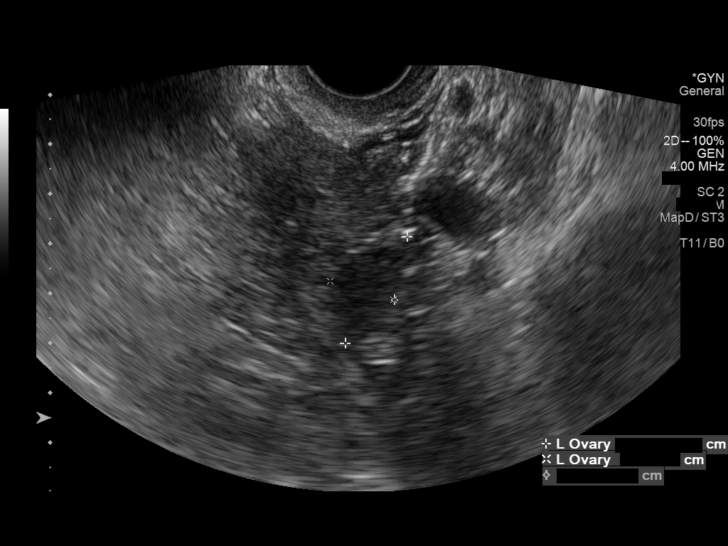
[im 45/50]
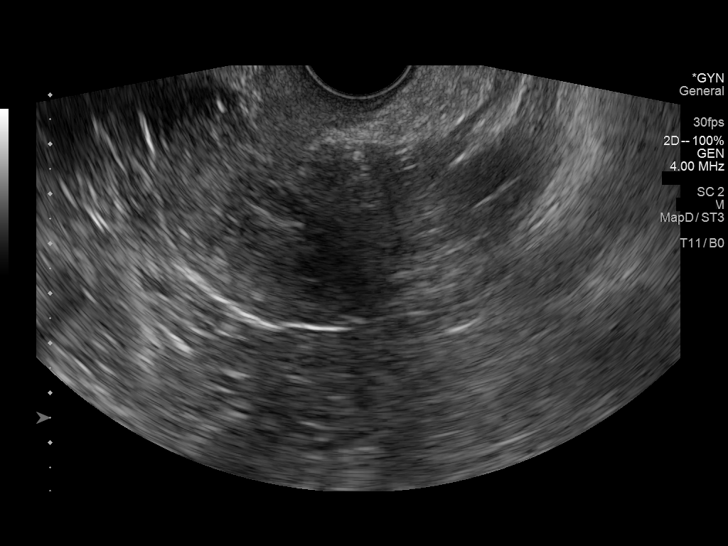
[im 50/50]
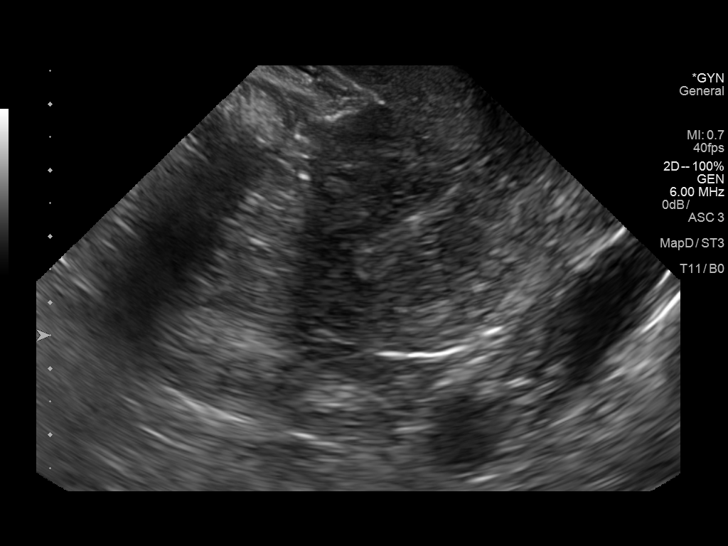

[14 of 25 positions shown; findings below may reference images not displayed]

FINDINGS: Uterus

Measurements: 8.4 x 3.4 x 4.5 cm. No fibroids or other mass
visualized.

Endometrium

Thickness: 6.3 mm.  No focal abnormality visualized.

Right ovary

Measurements: 2.1 x 1.5 x 1.5 cm. Normal appearance/no adnexal mass.

Left ovary

Measurements: 2.5 x 1.3 x 1.5 cm. Normal appearance/no adnexal mass.

Other findings

No abnormal free fluid.
IMPRESSION: No acute or focal abnormality identified.

## 2017-05-22 DIAGNOSIS — Z0000 Encounter for general adult medical examination without abnormal findings: Secondary | ICD-10-CM | POA: Diagnosis not present

## 2017-05-22 DIAGNOSIS — Z23 Encounter for immunization: Secondary | ICD-10-CM | POA: Diagnosis not present

## 2017-05-22 DIAGNOSIS — Z1389 Encounter for screening for other disorder: Secondary | ICD-10-CM | POA: Diagnosis not present

## 2017-05-22 DIAGNOSIS — Z780 Asymptomatic menopausal state: Secondary | ICD-10-CM | POA: Diagnosis not present

## 2017-05-22 DIAGNOSIS — F329 Major depressive disorder, single episode, unspecified: Secondary | ICD-10-CM | POA: Diagnosis not present

## 2017-05-22 DIAGNOSIS — Z6832 Body mass index (BMI) 32.0-32.9, adult: Secondary | ICD-10-CM | POA: Diagnosis not present

## 2017-05-22 DIAGNOSIS — K210 Gastro-esophageal reflux disease with esophagitis: Secondary | ICD-10-CM | POA: Diagnosis not present

## 2017-05-22 DIAGNOSIS — G629 Polyneuropathy, unspecified: Secondary | ICD-10-CM | POA: Diagnosis not present

## 2017-05-22 DIAGNOSIS — E785 Hyperlipidemia, unspecified: Secondary | ICD-10-CM | POA: Diagnosis not present

## 2017-05-22 DIAGNOSIS — N183 Chronic kidney disease, stage 3 (moderate): Secondary | ICD-10-CM | POA: Diagnosis not present

## 2017-05-22 DIAGNOSIS — E1142 Type 2 diabetes mellitus with diabetic polyneuropathy: Secondary | ICD-10-CM | POA: Diagnosis not present

## 2017-08-29 DIAGNOSIS — H35363 Drusen (degenerative) of macula, bilateral: Secondary | ICD-10-CM | POA: Diagnosis not present

## 2017-08-29 DIAGNOSIS — H2513 Age-related nuclear cataract, bilateral: Secondary | ICD-10-CM | POA: Diagnosis not present

## 2017-08-29 DIAGNOSIS — E119 Type 2 diabetes mellitus without complications: Secondary | ICD-10-CM | POA: Diagnosis not present

## 2017-10-12 DIAGNOSIS — E119 Type 2 diabetes mellitus without complications: Secondary | ICD-10-CM | POA: Diagnosis not present

## 2017-10-12 DIAGNOSIS — H25043 Posterior subcapsular polar age-related cataract, bilateral: Secondary | ICD-10-CM | POA: Diagnosis not present

## 2017-10-12 DIAGNOSIS — H2512 Age-related nuclear cataract, left eye: Secondary | ICD-10-CM | POA: Diagnosis not present

## 2017-10-12 DIAGNOSIS — H25013 Cortical age-related cataract, bilateral: Secondary | ICD-10-CM | POA: Diagnosis not present

## 2017-10-12 DIAGNOSIS — H2513 Age-related nuclear cataract, bilateral: Secondary | ICD-10-CM | POA: Diagnosis not present

## 2017-10-31 DIAGNOSIS — H2510 Age-related nuclear cataract, unspecified eye: Secondary | ICD-10-CM | POA: Insufficient documentation

## 2017-11-01 DIAGNOSIS — E669 Obesity, unspecified: Secondary | ICD-10-CM | POA: Diagnosis not present

## 2017-11-01 DIAGNOSIS — Z885 Allergy status to narcotic agent status: Secondary | ICD-10-CM | POA: Diagnosis not present

## 2017-11-01 DIAGNOSIS — Z79899 Other long term (current) drug therapy: Secondary | ICD-10-CM | POA: Diagnosis not present

## 2017-11-01 DIAGNOSIS — H2512 Age-related nuclear cataract, left eye: Secondary | ICD-10-CM | POA: Diagnosis not present

## 2017-11-01 DIAGNOSIS — E1136 Type 2 diabetes mellitus with diabetic cataract: Secondary | ICD-10-CM | POA: Diagnosis not present

## 2017-11-01 DIAGNOSIS — Z6831 Body mass index (BMI) 31.0-31.9, adult: Secondary | ICD-10-CM | POA: Diagnosis not present

## 2017-11-01 DIAGNOSIS — Z881 Allergy status to other antibiotic agents status: Secondary | ICD-10-CM | POA: Diagnosis not present

## 2017-11-02 DIAGNOSIS — H2511 Age-related nuclear cataract, right eye: Secondary | ICD-10-CM | POA: Diagnosis not present

## 2017-11-21 DIAGNOSIS — F329 Major depressive disorder, single episode, unspecified: Secondary | ICD-10-CM | POA: Diagnosis not present

## 2017-11-21 DIAGNOSIS — Z780 Asymptomatic menopausal state: Secondary | ICD-10-CM | POA: Diagnosis not present

## 2017-11-21 DIAGNOSIS — Z1239 Encounter for other screening for malignant neoplasm of breast: Secondary | ICD-10-CM | POA: Diagnosis not present

## 2017-11-21 DIAGNOSIS — N183 Chronic kidney disease, stage 3 (moderate): Secondary | ICD-10-CM | POA: Diagnosis not present

## 2017-11-21 DIAGNOSIS — E1142 Type 2 diabetes mellitus with diabetic polyneuropathy: Secondary | ICD-10-CM | POA: Diagnosis not present

## 2017-11-21 DIAGNOSIS — E785 Hyperlipidemia, unspecified: Secondary | ICD-10-CM | POA: Diagnosis not present

## 2017-11-21 DIAGNOSIS — G629 Polyneuropathy, unspecified: Secondary | ICD-10-CM | POA: Diagnosis not present

## 2017-11-21 DIAGNOSIS — Z1159 Encounter for screening for other viral diseases: Secondary | ICD-10-CM | POA: Diagnosis not present

## 2017-11-22 DIAGNOSIS — Z9842 Cataract extraction status, left eye: Secondary | ICD-10-CM | POA: Diagnosis not present

## 2017-11-22 DIAGNOSIS — E1136 Type 2 diabetes mellitus with diabetic cataract: Secondary | ICD-10-CM | POA: Diagnosis not present

## 2017-11-22 DIAGNOSIS — Z885 Allergy status to narcotic agent status: Secondary | ICD-10-CM | POA: Diagnosis not present

## 2017-11-22 DIAGNOSIS — H2511 Age-related nuclear cataract, right eye: Secondary | ICD-10-CM | POA: Diagnosis not present

## 2017-11-22 DIAGNOSIS — H02836 Dermatochalasis of left eye, unspecified eyelid: Secondary | ICD-10-CM | POA: Diagnosis not present

## 2017-11-22 DIAGNOSIS — F329 Major depressive disorder, single episode, unspecified: Secondary | ICD-10-CM | POA: Diagnosis not present

## 2017-11-22 DIAGNOSIS — E7800 Pure hypercholesterolemia, unspecified: Secondary | ICD-10-CM | POA: Diagnosis not present

## 2017-11-22 DIAGNOSIS — Z87891 Personal history of nicotine dependence: Secondary | ICD-10-CM | POA: Diagnosis not present

## 2017-11-22 DIAGNOSIS — Z961 Presence of intraocular lens: Secondary | ICD-10-CM | POA: Diagnosis not present

## 2017-11-22 DIAGNOSIS — H18413 Arcus senilis, bilateral: Secondary | ICD-10-CM | POA: Diagnosis not present

## 2017-11-22 DIAGNOSIS — H25811 Combined forms of age-related cataract, right eye: Secondary | ICD-10-CM | POA: Diagnosis not present

## 2017-11-22 DIAGNOSIS — Z881 Allergy status to other antibiotic agents status: Secondary | ICD-10-CM | POA: Diagnosis not present

## 2017-11-22 DIAGNOSIS — Z79899 Other long term (current) drug therapy: Secondary | ICD-10-CM | POA: Diagnosis not present

## 2017-11-22 DIAGNOSIS — H02833 Dermatochalasis of right eye, unspecified eyelid: Secondary | ICD-10-CM | POA: Diagnosis not present

## 2017-12-12 DIAGNOSIS — Z1231 Encounter for screening mammogram for malignant neoplasm of breast: Secondary | ICD-10-CM | POA: Diagnosis not present

## 2017-12-28 DIAGNOSIS — Z780 Asymptomatic menopausal state: Secondary | ICD-10-CM | POA: Diagnosis not present

## 2018-02-20 DIAGNOSIS — E1142 Type 2 diabetes mellitus with diabetic polyneuropathy: Secondary | ICD-10-CM | POA: Diagnosis not present

## 2018-03-14 ENCOUNTER — Encounter: Payer: Medicare Other | Attending: Internal Medicine | Admitting: *Deleted

## 2018-03-14 DIAGNOSIS — E1142 Type 2 diabetes mellitus with diabetic polyneuropathy: Secondary | ICD-10-CM | POA: Insufficient documentation

## 2018-03-14 DIAGNOSIS — E1122 Type 2 diabetes mellitus with diabetic chronic kidney disease: Secondary | ICD-10-CM | POA: Diagnosis not present

## 2018-03-14 DIAGNOSIS — Z713 Dietary counseling and surveillance: Secondary | ICD-10-CM | POA: Insufficient documentation

## 2018-03-14 DIAGNOSIS — E119 Type 2 diabetes mellitus without complications: Secondary | ICD-10-CM

## 2018-03-14 DIAGNOSIS — N183 Chronic kidney disease, stage 3 (moderate): Secondary | ICD-10-CM | POA: Diagnosis not present

## 2018-03-14 NOTE — Patient Instructions (Addendum)
Plan:  Aim for 2 Carb Choices per meal (30 grams) +/- 1 either way  Aim for 0-1 Carbs per snack if hungry  Include protein in moderation with your meals and snacks Continue reading food labels for Total Carbohydrate of foods We will discuss increasing your activity level at any future visit Consider checking BG at alternate times per day  Continue taking medication as directed by MD

## 2018-03-14 NOTE — Progress Notes (Signed)
Diabetes Self-Management Education  Visit Type: First/Initial  Appt. Start Time: 0800 Appt. 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 female with a diagnosis of Diabetes: Type 2. Patient expresses great anxiety in having diabetes along with fear of many complications that could occur. She states she has battled her weight most of her life and feels she could always do better. She enjoys painting and art work, she is currently retired. She knows of one of her diagnosis as being CKD Stage 3 but does not know what that means or how severe that is to her overall health. She is also worried about her cholesterol levels again even though she does not know what her numbers are.   ASSESSMENT  There were no vitals taken for this visit. There is no height or weight on file to calculate BMI.  Diabetes Self-Management Education - 03/14/18 0950      Visit Information   Visit Type  First/Initial      Initial Visit   Diabetes Type  Type 2    Are you currently following a meal plan?  No    Are you taking your medications as prescribed?  Yes    Date Diagnosed  2014      Health Coping   How would you rate your overall health?  Fair      Psychosocial Assessment   Patient Belief/Attitude about Diabetes  Afraid    Self-care barriers  None    Other persons present  Patient    Patient Concerns  Nutrition/Meal planning;Glycemic Control    Special Needs  None    Preferred Learning Style  No preference indicated    Learning Readiness  Change in progress    How often do you need to have someone help you when you read instructions, pamphlets, or other written materials from your doctor or pharmacy?  1 - Never    What is the last grade level you completed in school?  12      Pre-Education Assessment   Patient understands the diabetes disease and treatment process.  Needs Instruction    Patient understands incorporating nutritional management into  lifestyle.  Needs Instruction    Patient undertands incorporating physical activity into lifestyle.  Needs Instruction    Patient understands using medications safely.  Needs Instruction    Patient understands monitoring blood glucose, interpreting and using results  Needs Instruction    Patient understands prevention, detection, and treatment of acute complications.  Needs Instruction    Patient understands prevention, detection, and treatment of chronic complications.  Needs Instruction    Patient understands how to develop strategies to address psychosocial issues.  Needs Instruction    Patient understands how to develop strategies to promote health/change behavior.  Needs Instruction      Complications   Last HgB A1C per patient/outside source  6.5 %    How often do you check your blood sugar?  3-4 times / week    Fasting Blood glucose range (mg/dL)  70-129    Postprandial Blood glucose range (mg/dL)  70-129;130-179    Have you had a dilated eye exam in the past 12 months?  Yes    Have you had a dental exam in the past 12 months?  Yes    Are you checking your feet?  Yes    How many days per week are you checking your feet?  7  Dietary Intake   Breakfast  cheerios with honey nut mixed with milk OR egg with 2 bacon, 1 whole wheat toast and milk    Snack (morning)  pretzels sometimes with cheese,     Lunch  eats out some: Reuben sandwich OR hamburger with lettuce and tomato and usually fries OR home: ham sandwich or hot dog occasionally with chips OR left overs    Snack (afternoon)  same as AM    Dinner  lean meat, starch and vegetables     Snack (evening)  ice cream @ 1 - 2 scoops    Beverage(s)  water, 1% milk, diet fruit juice, occasionally glass of wine or mixed drink      Exercise   Exercise Type  ADL's    How many days per week to you exercise?  3    How many minutes per day do you exercise?  30    Total minutes per week of exercise  90      Patient Education   Previous  Diabetes Education  Yes (please comment)   2014   Disease state   Definition of diabetes, type 1 and 2, and the diagnosis of diabetes;Factors that contribute to the development of diabetes    Nutrition management   Role of diet in the treatment of diabetes and the relationship between the three main macronutrients and blood glucose level;Food label reading, portion sizes and measuring food.;Carbohydrate counting;Reviewed blood glucose goals for pre and post meals and how to evaluate the patients' food intake on their blood glucose level.    Physical activity and exercise   Role of exercise on diabetes management, blood pressure control and cardiac health.    Monitoring  Identified appropriate SMBG and/or A1C goals.;Purpose and frequency of SMBG.    Chronic complications  Relationship between chronic complications and blood glucose control;Nephropathy, what it is, prevention of, the use of ACE, ARB's and early detection of through urine microalbumia.    Psychosocial adjustment  Worked with patient to identify barriers to care and solutions;Role of stress on diabetes      Individualized Goals (developed by patient)   Nutrition  Follow meal plan discussed    Physical Activity  Exercise 3-5 times per week    Medications  Not Applicable    Monitoring   test blood glucose pre and post meals as discussed      Post-Education Assessment   Patient understands the diabetes disease and treatment process.  Demonstrates understanding / competency    Patient understands incorporating nutritional management into lifestyle.  Demonstrates understanding / competency    Patient undertands incorporating physical activity into lifestyle.  Needs Review    Patient understands monitoring blood glucose, interpreting and using results  Demonstrates understanding / competency    Patient understands prevention, detection, and treatment of chronic complications.  Demonstrates understanding / competency    Patient understands  how to develop strategies to address psychosocial issues.  Needs Review    Patient understands how to develop strategies to promote health/change behavior.  Demonstrates understanding / competency      Outcomes   Expected Outcomes  Demonstrated interest in learning. Expect positive outcomes    Future DMSE  3-4 months    Program Status  Not Completed       Individualized Plan for Diabetes Self-Management Training:   Learning Objective:  Patient will have a greater understanding of diabetes self-management. Patient education plan is to attend individual and/or group sessions per assessed needs and concerns.  Plan:   Patient Instructions  Plan:  Aim for 2 Carb Choices per meal (30 grams) +/- 1 either way  Aim for 0-1 Carbs per snack if hungry  Include protein in moderation with your meals and snacks Continue reading food labels for Total Carbohydrate of foods We will discuss increasing your activity level at any future visit Consider checking BG at alternate times per day  Continue taking medication as directed by MD  Expected Outcomes:  Demonstrated interest in learning. Expect positive outcomes  Education material provided: Food label handouts, A1C conversion sheet, Meal plan card and Carbohydrate counting sheet  If problems or questions, patient to contact team via:  Phone  Future DSME appointment: 3-4 months

## 2018-05-25 DIAGNOSIS — N183 Chronic kidney disease, stage 3 (moderate): Secondary | ICD-10-CM | POA: Diagnosis not present

## 2018-05-25 DIAGNOSIS — Z0000 Encounter for general adult medical examination without abnormal findings: Secondary | ICD-10-CM | POA: Diagnosis not present

## 2018-05-25 DIAGNOSIS — E785 Hyperlipidemia, unspecified: Secondary | ICD-10-CM | POA: Diagnosis not present

## 2018-05-25 DIAGNOSIS — F329 Major depressive disorder, single episode, unspecified: Secondary | ICD-10-CM | POA: Diagnosis not present

## 2018-05-25 DIAGNOSIS — G629 Polyneuropathy, unspecified: Secondary | ICD-10-CM | POA: Diagnosis not present

## 2018-05-25 DIAGNOSIS — Z1389 Encounter for screening for other disorder: Secondary | ICD-10-CM | POA: Diagnosis not present

## 2018-05-25 DIAGNOSIS — Z6832 Body mass index (BMI) 32.0-32.9, adult: Secondary | ICD-10-CM | POA: Diagnosis not present

## 2018-05-25 DIAGNOSIS — E1142 Type 2 diabetes mellitus with diabetic polyneuropathy: Secondary | ICD-10-CM | POA: Diagnosis not present

## 2018-06-13 DIAGNOSIS — E119 Type 2 diabetes mellitus without complications: Secondary | ICD-10-CM | POA: Diagnosis not present

## 2018-06-13 DIAGNOSIS — Z961 Presence of intraocular lens: Secondary | ICD-10-CM | POA: Diagnosis not present

## 2018-06-13 DIAGNOSIS — H35363 Drusen (degenerative) of macula, bilateral: Secondary | ICD-10-CM | POA: Diagnosis not present

## 2018-06-27 ENCOUNTER — Other Ambulatory Visit: Payer: Self-pay

## 2018-06-27 ENCOUNTER — Encounter: Payer: Self-pay | Admitting: Podiatry

## 2018-06-27 ENCOUNTER — Ambulatory Visit (INDEPENDENT_AMBULATORY_CARE_PROVIDER_SITE_OTHER): Payer: Medicare Other | Admitting: Podiatry

## 2018-06-27 VITALS — BP 129/79 | HR 70 | Temp 96.8°F

## 2018-06-27 DIAGNOSIS — E119 Type 2 diabetes mellitus without complications: Secondary | ICD-10-CM

## 2018-06-27 DIAGNOSIS — L84 Corns and callosities: Secondary | ICD-10-CM

## 2018-07-01 NOTE — Progress Notes (Signed)
Subjective: Debra Norris presents today referred by Leeroy Cha, MD for diabetic foot examination on today. She relates calluses b/l feet which she has trimmed in the past with scissors.   She admits 4.5 years history of diabetes. Denies any foot wounds  She relates she likes to walk barefoot. She also relates, "I did get a splinter in my foot the other day and got it out".  Past Medical History:  Diagnosis Date  . Arthritis    hands  . Diabetes mellitus without complication ([place])    type 2 diet controlled  . PONV (postoperative nausea and vomiting)    nausea only    Patient Active Problem List   Diagnosis Date Noted  . Senile nuclear sclerosis 10/31/2017  . Hypercholesteremia 01/12/2017  . Type 2 diabetes mellitus without complication, without long-term current use of insulin ([place]) 01/12/2017    Past Surgical History:  Procedure Laterality Date  . APPENDECTOMY    . bunions remoned from feet Bilateral few yrs ago  . COLONOSCOPY WITH PROPOFOL N/A 08/17/2016   Procedure: COLONOSCOPY WITH PROPOFOL;  Surgeon: Garlan Fair, MD;  Location: [facility];  Service: Endoscopy;  Laterality: N/A;  . colonscopy     x 2  . LEEP  yrs ago  . [place]   lower  . TUBAL LIGATION       Current Outpatient Medications:  .  meloxicam (MOBIC) 15 MG tablet, TAKE 1 TABLET BY MOUTH DAILY, Disp: , Rfl:  .  atorvastatin (LIPITOR) 10 MG tablet, Take 10 mg by mouth daily., Disp: , Rfl:  .  Besifloxacin HCl 0.6 % SUSP, 1 drop 3 (three) times a day., Disp: , Rfl:  .  beta carotene w/minerals (OCUVITE) tablet, Take 1 tablet by mouth daily., Disp: , Rfl:  .  Bromfenac Sodium 0.07 % SOLN, Place 1 drop into the right eye daily., Disp: , Rfl:  .  buPROPion (WELLBUTRIN XL) 300 MG 24 hr tablet, Take 300 mg by mouth daily. , Disp: , Rfl:  .  cholecalciferol (VITAMIN D) 1000 units tablet, Take by mouth., Disp: , Rfl:  .  Difluprednate 0.05 % EMUL, 1 drop 4 (four) times daily.,  Disp: , Rfl:  .  glucose blood (PRECISION QID TEST) test strip, by Misc.(Non-Drug; Combo Route) route., Disp: , Rfl:  .  ibuprofen (ADVIL,MOTRIN) 200 MG tablet, Take 200-400 mg by mouth daily as needed for headache or moderate pain., Disp: , Rfl:  .  Lancets (ONETOUCH DELICA PLUS [id number]) MISC, U 1 LANCET  ONCE A DAY TO CHECK BS, Disp: , Rfl:  .  metFORMIN (GLUCOPHAGE) 500 MG tablet, TK 1 T PO 1 TIME IN THE EVE WAC, Disp: , Rfl:  .  MULTIPLE VITAMIN PO, Take by mouth., Disp: , Rfl:  .  Polyethyl Glycol-Propyl Glycol (SYSTANE OP), Apply 1 drop to eye daily as needed (dry eyes)., Disp: , Rfl:   Allergies  Allergen Reactions  . Erythromycin     unknown  . Demerol [Meperidine]     unknowm    Social History   Occupational History  . Not on file  Tobacco Use  . Smoking status: Former Research scientist (life sciences)  . Smokeless tobacco: Never Used  Substance and Sexual Activity  . Alcohol use: Yes    Comment: social  . Drug use: No    Comment: social smoker quit 2010  . Sexual activity: Not on file    History reviewed. No pertinent family history.   There is no immunization  history on file for this patient.  Review of systems: Positive Findings in bold print.  Constitutional:  chills, fatigue, fever, sweats, weight change Communication: Optometrist, sign Ecologist, hand writing, iPad/Android device Head: headaches, head injury Eyes: changes in vision, eye pain, glaucoma, cataracts, macular degeneration, diplopia, glare,  light sensitivity, eyeglasses or contacts, blindness Ears nose mouth throat: hearing impaired, hearing aids,  ringing in ears, deaf, sign language,  vertigo,   nosebleeds,  rhinitis,  cold sores, snoring, swollen glands Cardiovascular: HTN, edema, arrhythmia, pacemaker in place, defibrillator in place, chest pain/tightness, chronic anticoagulation, blood clot, heart failure, MI Peripheral Vascular: leg cramps, varicose veins, blood clots, lymphedema,  varicosities Respiratory:  difficulty breathing, denies congestion, SOB, wheezing, cough, emphysema Gastrointestinal: change in appetite or weight, abdominal pain, constipation, diarrhea, nausea, vomiting, vomiting blood, change in bowel habits, abdominal pain, jaundice, rectal bleeding, hemorrhoids, GERD Genitourinary:  nocturia,  pain on urination, polyuria,  blood in urine, Foley catheter, urinary urgency, ESRD on hemodialysis Musculoskeletal: amputation, cramping, stiff joints, painful joints, decreased joint motion, fractures, OA, gout, hemiplegia, paraplegia, uses cane, wheelchair bound, uses walker, uses rollator Skin: +changes in toenails, color change, dryness, itching, mole changes,  rash, wound(s) Neurological: headaches, numbness in feet, paresthesias in feet, burning in feet, fainting,  seizures, change in speech. denies headaches, memory problems/poor historian, cerebral palsy, weakness, paralysis, CVA, TIA Endocrine: diabetes, hypothyroidism, hyperthyroidism,  goiter, dry mouth, flushing, heat intolerance,  cold intolerance,  excessive thirst, denies polyuria,  nocturia Hematological:  easy bleeding, excessive bleeding, easy bruising, enlarged lymph nodes, on long term blood thinner, history of past transusions Allergy/immunological:  hives, eczema, frequent infections, multiple drug allergies, seasonal allergies, transplant recipient, multiple food allergies Psychiatric:  anxiety, depression, mood disorder, suicidal ideations, hallucinations, insomnia  Objective: Vascular Examination: Capillary refill time immediate x 10 digits  Dorsalis pedis pulses palpable b/l  Posterior tibial pulses palpable b/l  Digital hair sparse x 10 digits  Skin temperature gradient WNL b/l  Dermatological Examination: Skin with normal turgor, texture and tone b/l  Toenails 1-5 b/l well manicured and adequate length.  Hyperkeratotic lesion submet head 1 b/l. No edema, no erythema, no drainage,  no flocculence.  Musculoskeletal: Muscle strength 5/5 to all LE muscle groups  Neurological: Sensation intact with 10 gram monofilament.  Vibratory sensation intact.  Assessment: 1. Calluses submet head 1 2. NIDDM  Plan: 1. Discussed diabetic foot care principles. Literature dispensed on today. Calluses pared submetatarsal head(s) 1 b/l utilizing sterile scalpel blade without incident. Advised patient to use pumice stone between visits. Avoid using sharp instruments on calluses. 2. Patient to continue soft, supportive shoe gear 3. Patient to report any pedal injuries to medical professional immediately. 4. Follow up 3 months.  5. Patient/POA to call should there be a concern in the interim.

## 2018-09-26 ENCOUNTER — Other Ambulatory Visit: Payer: Self-pay

## 2018-09-26 ENCOUNTER — Ambulatory Visit (INDEPENDENT_AMBULATORY_CARE_PROVIDER_SITE_OTHER): Payer: Medicare Other | Admitting: Podiatry

## 2018-09-26 ENCOUNTER — Encounter: Payer: Self-pay | Admitting: Podiatry

## 2018-09-26 VITALS — Temp 97.9°F

## 2018-09-26 DIAGNOSIS — L84 Corns and callosities: Secondary | ICD-10-CM | POA: Diagnosis not present

## 2018-09-26 DIAGNOSIS — E119 Type 2 diabetes mellitus without complications: Secondary | ICD-10-CM

## 2018-09-26 NOTE — Patient Instructions (Signed)
Diabetes Mellitus and Foot Care Foot care is an important part of your health, especially when you have diabetes. Diabetes may cause you to have problems because of poor blood flow (circulation) to your feet and legs, which can cause your skin to:  Become thinner and drier.  Break more easily.  Heal more slowly.  Peel and crack. You may also have nerve damage (neuropathy) in your legs and feet, causing decreased feeling in them. This means that you may not notice minor injuries to your feet that could lead to more serious problems. Noticing and addressing any potential problems early is the best way to prevent future foot problems. How to care for your feet Foot hygiene  Wash your feet daily with warm water and mild soap. Do not use hot water. Then, pat your feet and the areas between your toes until they are completely dry. Do not soak your feet as this can dry your skin.  Trim your toenails straight across. Do not dig under them or around the cuticle. File the edges of your nails with an emery board or nail file.  Apply a moisturizing lotion or petroleum jelly to the skin on your feet and to dry, brittle toenails. Use lotion that does not contain alcohol and is unscented. Do not apply lotion between your toes. Shoes and socks  Wear clean socks or stockings every day. Make sure they are not too tight. Do not wear knee-high stockings since they may decrease blood flow to your legs.  Wear shoes that fit properly and have enough cushioning. Always look in your shoes before you put them on to be sure there are no objects inside.  To break in new shoes, wear them for just a few hours a day. This prevents injuries on your feet. Wounds, scrapes, corns, and calluses  Check your feet daily for blisters, cuts, bruises, sores, and redness. If you cannot see the bottom of your feet, use a mirror or ask someone for help.  Do not cut corns or calluses or try to remove them with medicine.  If you  find a minor scrape, cut, or break in the skin on your feet, keep it and the skin around it clean and dry. You may clean these areas with mild soap and water. Do not clean the area with peroxide, alcohol, or iodine.  If you have a wound, scrape, corn, or callus on your foot, look at it several times a day to make sure it is healing and not infected. Check for: ? Redness, swelling, or pain. ? Fluid or blood. ? Warmth. ? Pus or a bad smell. General instructions  Do not cross your legs. This may decrease blood flow to your feet.  Do not use heating pads or hot water bottles on your feet. They may burn your skin. If you have lost feeling in your feet or legs, you may not know this is happening until it is too late.  Protect your feet from hot and cold by wearing shoes, such as at the beach or on hot pavement.  Schedule a complete foot exam at least once a year (annually) or more often if you have foot problems. If you have foot problems, report any cuts, sores, or bruises to your health care provider immediately. Contact a health care provider if:  You have a medical condition that increases your risk of infection and you have any cuts, sores, or bruises on your feet.  You have an injury that is not   healing.  You have redness on your legs or feet.  You feel burning or tingling in your legs or feet.  You have pain or cramps in your legs and feet.  Your legs or feet are numb.  Your feet always feel cold.  You have pain around a toenail. Get help right away if:  You have a wound, scrape, corn, or callus on your foot and: ? You have pain, swelling, or redness that gets worse. ? You have fluid or blood coming from the wound, scrape, corn, or callus. ? Your wound, scrape, corn, or callus feels warm to the touch. ? You have pus or a bad smell coming from the wound, scrape, corn, or callus. ? You have a fever. ? You have a red line going up your leg. Summary  Check your feet every day  for cuts, sores, red spots, swelling, and blisters.  Moisturize feet and legs daily.  Wear shoes that fit properly and have enough cushioning.  If you have foot problems, report any cuts, sores, or bruises to your health care provider immediately.  Schedule a complete foot exam at least once a year (annually) or more often if you have foot problems. This information is not intended to replace advice given to you by your health care provider. Make sure you discuss any questions you have with your health care provider. Document Released: 03/11/2000 Document Revised: 04/26/2017 Document Reviewed: 04/15/2016 Elsevier Patient Education  2020 [employer]  Corns and Calluses Corns are small areas of thickened skin that occur on the top, sides, or tip of a toe. They contain a cone-shaped core with a point that can press on a nerve below. This causes pain.  Calluses are areas of thickened skin that can occur anywhere on the body, including the hands, fingers, palms, soles of the feet, and heels. Calluses are usually larger than corns. What are the causes? Corns and calluses are caused by rubbing (friction) or pressure, such as from shoes that are too tight or do not fit properly. What increases the risk? Corns are more likely to develop in people who have misshapen toes (toe deformities), such as hammer toes. Calluses can occur with friction to any area of the skin. They are more likely to develop in people who:  Work with their hands.  Wear shoes that fit poorly, are too tight, or are high-heeled.  Have toe deformities. What are the signs or symptoms? Symptoms of a corn or callus include:  A hard growth on the skin.  Pain or tenderness under the skin.  Redness and swelling.  Increased discomfort while wearing tight-fitting shoes, if your feet are affected. If a corn or callus becomes infected, symptoms may include:  Redness and swelling that gets worse.  Pain.  Fluid, blood, or  pus draining from the corn or callus. How is this diagnosed? Corns and calluses may be diagnosed based on your symptoms, your medical history, and a physical exam. How is this treated? Treatment for corns and calluses may include:  Removing the cause of the friction or pressure. This may involve: ? Changing your shoes. ? Wearing shoe inserts (orthotics) or other protective layers in your shoes, such as a corn pad. ? Wearing gloves.  Applying medicine to the skin (topical medicine) to help soften skin in the hardened, thickened areas.  Removing layers of dead skin with a file to reduce the size of the corn or callus.  Removing the corn or callus with a scalpel or   laser.  Taking antibiotic medicines, if your corn or callus is infected.  Having surgery, if a toe deformity is the cause. Follow these instructions at home:   Take over-the-counter and prescription medicines only as told by your health care provider.  If you were prescribed an antibiotic, take it as told by your health care provider. Do not stop taking it even if your condition starts to improve.  Wear shoes that fit well. Avoid wearing high-heeled shoes and shoes that are too tight or too loose.  Wear any padding, protective layers, gloves, or orthotics as told by your health care provider.  Soak your hands or feet and then use a file or pumice stone to soften your corn or callus. Do this as told by your health care provider.  Check your corn or callus every day for symptoms of infection. Contact a health care provider if you:  Notice that your symptoms do not improve with treatment.  Have redness or swelling that gets worse.  Notice that your corn or callus becomes painful.  Have fluid, blood, or pus coming from your corn or callus.  Have new symptoms. Summary  Corns are small areas of thickened skin that occur on the top, sides, or tip of a toe.  Calluses are areas of thickened skin that can occur anywhere  on the body, including the hands, fingers, palms, and soles of the feet. Calluses are usually larger than corns.  Corns and calluses are caused by rubbing (friction) or pressure, such as from shoes that are too tight or do not fit properly.  Treatment may include wearing any padding, protective layers, gloves, or orthotics as told by your health care provider. This information is not intended to replace advice given to you by your health care provider. Make sure you discuss any questions you have with your health care provider. Document Released: 12/19/2003 Document Revised: 07/04/2018 Document Reviewed: 01/25/2017 Elsevier Patient Education  2020 [employer]  

## 2018-09-30 NOTE — Progress Notes (Signed)
Subjective: Debra Norris presents to clinic with cc of painful mycotic toenails and calluses b/l feet which are aggravated when weightbearing with and without shoe gear.    She uses pumice stone on her feet and feels she is able to manage them periodically.   Leeroy Cha, MD is her PCP. Last visit was 05/25/2018.   Current Outpatient Medications:  .  atorvastatin (LIPITOR) 10 MG tablet, Take 10 mg by mouth daily., Disp: , Rfl:  .  Besifloxacin HCl 0.6 % SUSP, 1 drop 3 (three) times a day., Disp: , Rfl:  .  beta carotene w/minerals (OCUVITE) tablet, Take 1 tablet by mouth daily., Disp: , Rfl:  .  Bromfenac Sodium 0.07 % SOLN, Place 1 drop into the right eye daily., Disp: , Rfl:  .  buPROPion (WELLBUTRIN XL) 300 MG 24 hr tablet, Take 300 mg by mouth daily. , Disp: , Rfl:  .  cholecalciferol (VITAMIN D) 1000 units tablet, Take by mouth., Disp: , Rfl:  .  Difluprednate 0.05 % EMUL, 1 drop 4 (four) times daily., Disp: , Rfl:  .  glucose blood (PRECISION QID TEST) test strip, by Misc.(Non-Drug; Combo Route) route., Disp: , Rfl:  .  ibuprofen (ADVIL,MOTRIN) 200 MG tablet, Take 200-400 mg by mouth daily as needed for headache or moderate pain., Disp: , Rfl:  .  Lancets (ONETOUCH DELICA PLUS [id number]) MISC, U 1 LANCET  ONCE A DAY TO CHECK BS, Disp: , Rfl:  .  meloxicam (MOBIC) 15 MG tablet, TAKE 1 TABLET BY MOUTH DAILY, Disp: , Rfl:  .  metFORMIN (GLUCOPHAGE) 500 MG tablet, TK 1 T PO 1 TIME IN THE EVE WAC, Disp: , Rfl:  .  metFORMIN (GLUCOPHAGE-XR) 500 MG 24 hr tablet, TK 1 T PO QD, Disp: , Rfl:  .  MULTIPLE VITAMIN PO, Take by mouth., Disp: , Rfl:  .  Polyethyl Glycol-Propyl Glycol (SYSTANE OP), Apply 1 drop to eye daily as needed (dry eyes)., Disp: , Rfl:    Allergies  Allergen Reactions  . Erythromycin     unknown  . Demerol [Meperidine]     unknowm     Objective: Vitals:   09/26/18 1011  Temp: 97.9 F (36.6 C)    Physical Examination:  Vascular   Examination: Capillary refill time immediate x 10 digits.  Palpable DP/PT pulses b/l.  Digital hair sparse b/l.  No edema noted b/l.  Skin temperature gradient WNL b/l.  Dermatological Examination: Skin with normal turgor, texture and tone b/l.  No open wounds b/l.  No interdigital macerations noted b/l.  Toenails 1-5 b/l well manicured.  Mild hyperkeratotic lesions submet head 1 b/l and hallux IPJ b/l. No preulcerative characteristics. No erythema, no edema, no drainage, no flocculence.  Musculoskeletal Examination: Muscle strength 5/5 to all muscle groups b/l.  No pain, crepitus or joint discomfort with active/passive ROM.  Neurological Examination: Sensation intact 5/5 b/l with 10 gram monofilament.  Vibratory sensation intact b/l.  Assessment: 1.  Calluses submet head 1 b/l and b/l hallux 2.  NIDDM  Plan: Calluses pared submetatarsal head(s) 1 b/l and b/l hallux utilizing sterile scalpel blade without incident. Patient feels she can manage her calluses with pumice stone periodically and we both agreed to twice yearly visits and will adjust appointments accordingly if need be. Instructed her to avoid using sharp instrumentation such as razors and metal files. Continue soft, supportive shoe gear daily. Report any pedal injuries to medical professional. Follow up 6 months. Patient/POA to call should there be  a question/concern in there interim.

## 2018-11-27 DIAGNOSIS — F329 Major depressive disorder, single episode, unspecified: Secondary | ICD-10-CM | POA: Diagnosis not present

## 2018-11-27 DIAGNOSIS — E1142 Type 2 diabetes mellitus with diabetic polyneuropathy: Secondary | ICD-10-CM | POA: Diagnosis not present

## 2018-11-27 DIAGNOSIS — E559 Vitamin D deficiency, unspecified: Secondary | ICD-10-CM | POA: Diagnosis not present

## 2018-11-27 DIAGNOSIS — Z7984 Long term (current) use of oral hypoglycemic drugs: Secondary | ICD-10-CM | POA: Diagnosis not present

## 2018-11-27 DIAGNOSIS — E785 Hyperlipidemia, unspecified: Secondary | ICD-10-CM | POA: Diagnosis not present

## 2018-12-17 DIAGNOSIS — Z1231 Encounter for screening mammogram for malignant neoplasm of breast: Secondary | ICD-10-CM | POA: Diagnosis not present

## 2019-03-27 ENCOUNTER — Ambulatory Visit: Payer: Medicare Other | Admitting: Podiatry

## 2019-04-02 ENCOUNTER — Encounter: Payer: Self-pay | Admitting: Podiatry

## 2019-04-02 ENCOUNTER — Other Ambulatory Visit: Payer: Self-pay

## 2019-04-02 ENCOUNTER — Ambulatory Visit (INDEPENDENT_AMBULATORY_CARE_PROVIDER_SITE_OTHER): Payer: Medicare Other | Admitting: Podiatry

## 2019-04-02 DIAGNOSIS — E119 Type 2 diabetes mellitus without complications: Secondary | ICD-10-CM | POA: Diagnosis not present

## 2019-04-02 DIAGNOSIS — L84 Corns and callosities: Secondary | ICD-10-CM

## 2019-04-02 NOTE — Patient Instructions (Signed)
Diabetes Mellitus and Foot Care Foot care is an important part of your health, especially when you have diabetes. Diabetes may cause you to have problems because of poor blood flow (circulation) to your feet and legs, which can cause your skin to:  Become thinner and drier.  Break more easily.  Heal more slowly.  Peel and crack. You may also have nerve damage (neuropathy) in your legs and feet, causing decreased feeling in them. This means that you may not notice minor injuries to your feet that could lead to more serious problems. Noticing and addressing any potential problems early is the best way to prevent future foot problems. How to care for your feet Foot hygiene  Wash your feet daily with warm water and mild soap. Do not use hot water. Then, pat your feet and the areas between your toes until they are completely dry. Do not soak your feet as this can dry your skin.  Trim your toenails straight across. Do not dig under them or around the cuticle. File the edges of your nails with an emery board or nail file.  Apply a moisturizing lotion or petroleum jelly to the skin on your feet and to dry, brittle toenails. Use lotion that does not contain alcohol and is unscented. Do not apply lotion between your toes. Shoes and socks  Wear clean socks or stockings every day. Make sure they are not too tight. Do not wear knee-high stockings since they may decrease blood flow to your legs.  Wear shoes that fit properly and have enough cushioning. Always look in your shoes before you put them on to be sure there are no objects inside.  To break in new shoes, wear them for just a few hours a day. This prevents injuries on your feet. Wounds, scrapes, corns, and calluses  Check your feet daily for blisters, cuts, bruises, sores, and redness. If you cannot see the bottom of your feet, use a mirror or ask someone for help.  Do not cut corns or calluses or try to remove them with medicine.  If you  find a minor scrape, cut, or break in the skin on your feet, keep it and the skin around it clean and dry. You may clean these areas with mild soap and water. Do not clean the area with peroxide, alcohol, or iodine.  If you have a wound, scrape, corn, or callus on your foot, look at it several times a day to make sure it is healing and not infected. Check for: ? Redness, swelling, or pain. ? Fluid or blood. ? Warmth. ? Pus or a bad smell. General instructions  Do not cross your legs. This may decrease blood flow to your feet.  Do not use heating pads or hot water bottles on your feet. They may burn your skin. If you have lost feeling in your feet or legs, you may not know this is happening until it is too late.  Protect your feet from hot and cold by wearing shoes, such as at the beach or on hot pavement.  Schedule a complete foot exam at least once a year (annually) or more often if you have foot problems. If you have foot problems, report any cuts, sores, or bruises to your health care provider immediately. Contact a health care provider if:  You have a medical condition that increases your risk of infection and you have any cuts, sores, or bruises on your feet.  You have an injury that is not   healing.  You have redness on your legs or feet.  You feel burning or tingling in your legs or feet.  You have pain or cramps in your legs and feet.  Your legs or feet are numb.  Your feet always feel cold.  You have pain around a toenail. Get help right away if:  You have a wound, scrape, corn, or callus on your foot and: ? You have pain, swelling, or redness that gets worse. ? You have fluid or blood coming from the wound, scrape, corn, or callus. ? Your wound, scrape, corn, or callus feels warm to the touch. ? You have pus or a bad smell coming from the wound, scrape, corn, or callus. ? You have a fever. ? You have a red line going up your leg. Summary  Check your feet every day  for cuts, sores, red spots, swelling, and blisters.  Moisturize feet and legs daily.  Wear shoes that fit properly and have enough cushioning.  If you have foot problems, report any cuts, sores, or bruises to your health care provider immediately.  Schedule a complete foot exam at least once a year (annually) or more often if you have foot problems. This information is not intended to replace advice given to you by your health care provider. Make sure you discuss any questions you have with your health care provider. Document Revised: 12/05/2018 Document Reviewed: 04/15/2016 Elsevier Patient Education  2020 [employer] Inc.  

## 2019-04-04 NOTE — Progress Notes (Signed)
Subjective: Debra Norris is a 69 y.o. y.o. female with h/o diabetes who presents today for preventative diabetic foot care. Patient has calluses b/l feet. She states she has been using scissors to trim her calluses and has not injured herself.   Leeroy Cha, MD is patient's PCP.   Medications reviewed in chart.  Allergies  Allergen Reactions  . Erythromycin     unknown  . Demerol [Meperidine]     unknowm   Objective: There were no vitals filed for this visit.  Vascular Examination: Capillary refill time to digits immediate b/l.  Dorsalis pedis and posterior tibial pulses palpable b/l.  Digital hair sparse b/l.  Skin temperature gradient WNL b/l.  Dermatological Examination: Skin with normal turgor, texture and tone b/l.  Toenails 1-5 b/l well manicured with adequate length.   Hyperkeratotic lesions b/l hallux and submet head 1 b/l. No erythema, no edema, no drainage, no flocculence noted.  Musculoskeletal: Muscle strength 5/5 to all LE muscle groups b/l.  Neurological: Sensation intact 5/5 b/l with 10 gram monofilament.  Vibratory sensation intact b/l.   Assessment: 1.  Calluses submet head 1 b/l and b/l hallux 2.  NIDDM  Plan: 1. Continue diabetic foot care principles. Literature dispensed on today.. 2. Discussed dangers of sharp instrumentation. She may use electric file such as [place], but not sharp instruments. She relates she will comply. 3. Hyperkeratotic lesions b/l hallux and submet head 1 b/l pared with sterile scalpel blade without incident. 4. Patient to continue soft, supportive shoe gear daily. 5. Patient to report any pedal injuries to medical professional immediately. 6. Follow up 6 month per patient request.  7. Patient/POA to call should there be a concern in the interim.

## 2019-05-18 ENCOUNTER — Ambulatory Visit: Payer: Medicare Other | Attending: Internal Medicine

## 2019-05-18 DIAGNOSIS — Z23 Encounter for immunization: Secondary | ICD-10-CM

## 2019-05-18 NOTE — Progress Notes (Signed)
   Covid-19 Vaccination Clinic  Name:  Debra Norris    MRN: [id number] DOB: April 10, 1950  05/18/2019  Debra Norris was observed post Covid-19 immunization for 15 minutes without incidence. She was provided with Vaccine Information Sheet and instruction to access the V-Safe system.   Debra Norris was instructed to call 911 with any severe reactions post vaccine: Marland Kitchen Difficulty breathing  . Swelling of your face and throat  . A fast heartbeat  . A bad rash all over your body  . Dizziness and weakness    Immunizations Administered    Name Date Dose VIS Date Route   Pfizer COVID-19 Vaccine 05/18/2019  2:01 PM 0.3 mL 03/08/2019 Intramuscular   Manufacturer: [place]   Lot: [id number]   [place]: [id number]

## 2019-05-29 DIAGNOSIS — Z961 Presence of intraocular lens: Secondary | ICD-10-CM | POA: Diagnosis not present

## 2019-05-29 DIAGNOSIS — E119 Type 2 diabetes mellitus without complications: Secondary | ICD-10-CM | POA: Diagnosis not present

## 2019-05-29 DIAGNOSIS — H35363 Drusen (degenerative) of macula, bilateral: Secondary | ICD-10-CM | POA: Diagnosis not present

## 2019-06-04 DIAGNOSIS — Z1389 Encounter for screening for other disorder: Secondary | ICD-10-CM | POA: Diagnosis not present

## 2019-06-04 DIAGNOSIS — E785 Hyperlipidemia, unspecified: Secondary | ICD-10-CM | POA: Diagnosis not present

## 2019-06-04 DIAGNOSIS — E1165 Type 2 diabetes mellitus with hyperglycemia: Secondary | ICD-10-CM | POA: Diagnosis not present

## 2019-06-04 DIAGNOSIS — Z0000 Encounter for general adult medical examination without abnormal findings: Secondary | ICD-10-CM | POA: Diagnosis not present

## 2019-06-04 DIAGNOSIS — E559 Vitamin D deficiency, unspecified: Secondary | ICD-10-CM | POA: Diagnosis not present

## 2019-06-04 DIAGNOSIS — E1142 Type 2 diabetes mellitus with diabetic polyneuropathy: Secondary | ICD-10-CM | POA: Diagnosis not present

## 2019-06-04 DIAGNOSIS — G629 Polyneuropathy, unspecified: Secondary | ICD-10-CM | POA: Diagnosis not present

## 2019-06-04 DIAGNOSIS — K2100 Gastro-esophageal reflux disease with esophagitis, without bleeding: Secondary | ICD-10-CM | POA: Diagnosis not present

## 2019-06-04 DIAGNOSIS — N183 Chronic kidney disease, stage 3 unspecified: Secondary | ICD-10-CM | POA: Diagnosis not present

## 2019-06-04 DIAGNOSIS — F3341 Major depressive disorder, recurrent, in partial remission: Secondary | ICD-10-CM | POA: Diagnosis not present

## 2019-06-04 DIAGNOSIS — H8110 Benign paroxysmal vertigo, unspecified ear: Secondary | ICD-10-CM | POA: Diagnosis not present

## 2019-06-04 DIAGNOSIS — N1831 Chronic kidney disease, stage 3a: Secondary | ICD-10-CM | POA: Diagnosis not present

## 2019-06-12 ENCOUNTER — Ambulatory Visit: Payer: Medicare Other | Attending: Internal Medicine

## 2019-06-12 DIAGNOSIS — Z23 Encounter for immunization: Secondary | ICD-10-CM

## 2019-06-12 NOTE — Progress Notes (Signed)
   Covid-19 Vaccination Clinic  Name:  Debra Norris    MRN: [id number] DOB: July 18, 1950  06/12/2019  [name] was observed post Covid-19 immunization for 15 minutes without incident. She was provided with Vaccine Information Sheet and instruction to access the V-Safe system.   [name] was instructed to call 911 with any severe reactions post vaccine: Marland Kitchen Difficulty breathing  . Swelling of face and throat  . A fast heartbeat  . A bad rash all over body  . Dizziness and weakness   Immunizations Administered    Name Date Dose VIS Date Route   Pfizer COVID-19 Vaccine 06/12/2019  9:55 AM 0.3 mL 03/08/2019 Intramuscular   Manufacturer: [place]   Lot: [id number]   [place]: [id number]

## 2019-06-20 DIAGNOSIS — M1711 Unilateral primary osteoarthritis, right knee: Secondary | ICD-10-CM | POA: Diagnosis not present

## 2019-06-20 DIAGNOSIS — M25461 Effusion, right knee: Secondary | ICD-10-CM | POA: Diagnosis not present

## 2019-07-17 DIAGNOSIS — M1711 Unilateral primary osteoarthritis, right knee: Secondary | ICD-10-CM | POA: Diagnosis not present

## 2019-09-17 DIAGNOSIS — Z6832 Body mass index (BMI) 32.0-32.9, adult: Secondary | ICD-10-CM | POA: Insufficient documentation

## 2019-09-17 DIAGNOSIS — E661 Drug-induced obesity: Secondary | ICD-10-CM | POA: Insufficient documentation

## 2019-09-17 DIAGNOSIS — E66811 Obesity, class 1: Secondary | ICD-10-CM | POA: Insufficient documentation

## 2019-09-23 DIAGNOSIS — E559 Vitamin D deficiency, unspecified: Secondary | ICD-10-CM | POA: Insufficient documentation

## 2019-09-23 DIAGNOSIS — F411 Generalized anxiety disorder: Secondary | ICD-10-CM | POA: Insufficient documentation

## 2019-10-01 ENCOUNTER — Ambulatory Visit: Payer: Medicare Other | Admitting: Podiatry

## 2019-12-04 DIAGNOSIS — R3121 Asymptomatic microscopic hematuria: Secondary | ICD-10-CM | POA: Diagnosis not present

## 2019-12-04 DIAGNOSIS — R8271 Bacteriuria: Secondary | ICD-10-CM | POA: Diagnosis not present

## 2019-12-17 DIAGNOSIS — R3121 Asymptomatic microscopic hematuria: Secondary | ICD-10-CM | POA: Diagnosis not present

## 2019-12-18 DIAGNOSIS — E119 Type 2 diabetes mellitus without complications: Secondary | ICD-10-CM | POA: Diagnosis not present

## 2019-12-18 DIAGNOSIS — F411 Generalized anxiety disorder: Secondary | ICD-10-CM | POA: Diagnosis not present

## 2019-12-18 DIAGNOSIS — E661 Drug-induced obesity: Secondary | ICD-10-CM | POA: Diagnosis not present

## 2019-12-18 DIAGNOSIS — Z6832 Body mass index (BMI) 32.0-32.9, adult: Secondary | ICD-10-CM | POA: Diagnosis not present

## 2019-12-18 DIAGNOSIS — E7800 Pure hypercholesterolemia, unspecified: Secondary | ICD-10-CM | POA: Diagnosis not present

## 2019-12-18 DIAGNOSIS — E559 Vitamin D deficiency, unspecified: Secondary | ICD-10-CM | POA: Diagnosis not present

## 2019-12-19 DIAGNOSIS — E661 Drug-induced obesity: Secondary | ICD-10-CM | POA: Diagnosis not present

## 2019-12-19 DIAGNOSIS — Z1231 Encounter for screening mammogram for malignant neoplasm of breast: Secondary | ICD-10-CM | POA: Diagnosis not present

## 2019-12-19 DIAGNOSIS — E559 Vitamin D deficiency, unspecified: Secondary | ICD-10-CM | POA: Diagnosis not present

## 2019-12-19 DIAGNOSIS — E7800 Pure hypercholesterolemia, unspecified: Secondary | ICD-10-CM | POA: Diagnosis not present

## 2019-12-19 DIAGNOSIS — Z6832 Body mass index (BMI) 32.0-32.9, adult: Secondary | ICD-10-CM | POA: Diagnosis not present

## 2019-12-19 DIAGNOSIS — E119 Type 2 diabetes mellitus without complications: Secondary | ICD-10-CM | POA: Diagnosis not present

## 2019-12-20 ENCOUNTER — Ambulatory Visit (INDEPENDENT_AMBULATORY_CARE_PROVIDER_SITE_OTHER): Payer: Medicare Other | Admitting: Podiatry

## 2019-12-20 ENCOUNTER — Encounter: Payer: Self-pay | Admitting: Podiatry

## 2019-12-20 ENCOUNTER — Other Ambulatory Visit: Payer: Self-pay

## 2019-12-20 DIAGNOSIS — E119 Type 2 diabetes mellitus without complications: Secondary | ICD-10-CM

## 2019-12-20 DIAGNOSIS — L84 Corns and callosities: Secondary | ICD-10-CM

## 2019-12-24 DIAGNOSIS — Z9049 Acquired absence of other specified parts of digestive tract: Secondary | ICD-10-CM | POA: Diagnosis not present

## 2019-12-24 DIAGNOSIS — R8271 Bacteriuria: Secondary | ICD-10-CM | POA: Diagnosis not present

## 2019-12-24 DIAGNOSIS — R3129 Other microscopic hematuria: Secondary | ICD-10-CM | POA: Diagnosis not present

## 2019-12-24 DIAGNOSIS — R3121 Asymptomatic microscopic hematuria: Secondary | ICD-10-CM | POA: Diagnosis not present

## 2019-12-24 NOTE — Progress Notes (Signed)
Subjective: Debra Norris is a 69 y.o. y.o. female with h/o diabetes who presents today for preventative diabetic foot care. Patient has calluses b/l feet. She states she has been using an Scientist, research (medical) on her calluses. She states her feet feel pretty good.  She feels she can manage her feet on her own.   Debra Norris., MD is patient's PCP.   Medications reviewed in chart.  Allergies  Allergen Reactions  . Erythromycin     unknown  . Demerol [Meperidine]     unknowm   Objective: There were no vitals filed for this visit.  Vascular Examination: Capillary refill time to digits immediate b/l.  Dorsalis pedis and posterior tibial pulses palpable b/l.  Digital hair sparse b/l.  Skin temperature gradient WNL b/l.  Dermatological Examination: Skin with normal turgor, texture and tone b/l.  Toenails 1-5 b/l well manicured with adequate length.   Minimal hyperkeratotic lesions b/l hallux and submet head 1 b/l. No erythema, no edema, no drainage, no flocculence noted.  Musculoskeletal: Muscle strength 5/5 to all LE muscle groups b/l.  Neurological: Sensation intact 5/5 b/l with 10 gram monofilament.  Vibratory sensation intact b/l.   Assessment: 1.  Calluses submet head 1 b/l and b/l hallux 2.  NIDDM  Plan: 1. Continue diabetic foot care principles. Literature dispensed on today.. 2. She feels she can manage her feet with routine filing of calluses. 3. Hyperkeratotic lesions b/l hallux and submet head 1 b/l pared with sterile scalpel blade without incident. 4. Patient to continue soft, supportive shoe gear daily. 5. Patient to report any pedal injuries to medical professional immediately. 6. Follow up one year. 7. Patient/POA to call should there be a concern in the interim.

## 2020-02-26 DIAGNOSIS — R3121 Asymptomatic microscopic hematuria: Secondary | ICD-10-CM | POA: Diagnosis not present

## 2020-04-29 DIAGNOSIS — E669 Obesity, unspecified: Secondary | ICD-10-CM | POA: Diagnosis not present

## 2020-04-29 DIAGNOSIS — G629 Polyneuropathy, unspecified: Secondary | ICD-10-CM | POA: Diagnosis not present

## 2020-04-29 DIAGNOSIS — E785 Hyperlipidemia, unspecified: Secondary | ICD-10-CM | POA: Insufficient documentation

## 2020-04-29 DIAGNOSIS — E7800 Pure hypercholesterolemia, unspecified: Secondary | ICD-10-CM | POA: Diagnosis not present

## 2020-04-29 DIAGNOSIS — N183 Chronic kidney disease, stage 3 unspecified: Secondary | ICD-10-CM | POA: Insufficient documentation

## 2020-04-29 DIAGNOSIS — R32 Unspecified urinary incontinence: Secondary | ICD-10-CM | POA: Insufficient documentation

## 2020-04-29 DIAGNOSIS — E559 Vitamin D deficiency, unspecified: Secondary | ICD-10-CM | POA: Diagnosis not present

## 2020-04-29 DIAGNOSIS — E1140 Type 2 diabetes mellitus with diabetic neuropathy, unspecified: Secondary | ICD-10-CM | POA: Diagnosis not present

## 2020-04-29 DIAGNOSIS — F3340 Major depressive disorder, recurrent, in remission, unspecified: Secondary | ICD-10-CM | POA: Insufficient documentation

## 2020-04-29 DIAGNOSIS — E119 Type 2 diabetes mellitus without complications: Secondary | ICD-10-CM | POA: Diagnosis not present

## 2020-04-29 DIAGNOSIS — K2100 Gastro-esophageal reflux disease with esophagitis, without bleeding: Secondary | ICD-10-CM | POA: Diagnosis not present

## 2020-04-29 DIAGNOSIS — F411 Generalized anxiety disorder: Secondary | ICD-10-CM | POA: Diagnosis not present

## 2020-04-30 DIAGNOSIS — E7800 Pure hypercholesterolemia, unspecified: Secondary | ICD-10-CM | POA: Diagnosis not present

## 2020-04-30 DIAGNOSIS — E785 Hyperlipidemia, unspecified: Secondary | ICD-10-CM | POA: Diagnosis not present

## 2020-04-30 DIAGNOSIS — E559 Vitamin D deficiency, unspecified: Secondary | ICD-10-CM | POA: Diagnosis not present

## 2020-04-30 DIAGNOSIS — E669 Obesity, unspecified: Secondary | ICD-10-CM | POA: Diagnosis not present

## 2020-04-30 DIAGNOSIS — E119 Type 2 diabetes mellitus without complications: Secondary | ICD-10-CM | POA: Diagnosis not present

## 2020-04-30 DIAGNOSIS — N183 Chronic kidney disease, stage 3 unspecified: Secondary | ICD-10-CM | POA: Diagnosis not present

## 2020-06-01 DIAGNOSIS — R3121 Asymptomatic microscopic hematuria: Secondary | ICD-10-CM | POA: Diagnosis not present

## 2020-06-09 DIAGNOSIS — E039 Hypothyroidism, unspecified: Secondary | ICD-10-CM | POA: Diagnosis not present

## 2020-12-16 ENCOUNTER — Ambulatory Visit (INDEPENDENT_AMBULATORY_CARE_PROVIDER_SITE_OTHER): Payer: Medicare Other | Admitting: Podiatry

## 2020-12-16 ENCOUNTER — Other Ambulatory Visit: Payer: Self-pay

## 2020-12-16 DIAGNOSIS — M2012 Hallux valgus (acquired), left foot: Secondary | ICD-10-CM | POA: Diagnosis not present

## 2020-12-16 DIAGNOSIS — E119 Type 2 diabetes mellitus without complications: Secondary | ICD-10-CM | POA: Diagnosis not present

## 2020-12-16 DIAGNOSIS — M2011 Hallux valgus (acquired), right foot: Secondary | ICD-10-CM | POA: Diagnosis not present

## 2020-12-16 NOTE — Patient Instructions (Signed)
Apply CeraVe Cream to rough skin of great toe once to twice daily.

## 2020-12-20 ENCOUNTER — Encounter: Payer: Self-pay | Admitting: Podiatry

## 2020-12-20 NOTE — Progress Notes (Signed)
ANNUAL DIABETIC FOOT EXAM  Subjective: Debra Norris presents today for for annual diabetic foot examination.  Patient denies any h/o foot wounds.  Patient denies any symptoms of foot numbness.  Patient denies any symptoms of foot tingling.  Patient denies any symptoms of burning in feet.  Patient denies any symptoms of pins/needles in feet.  Patient's blood sugar was 90 mg/dl today.   Debra Norris., MD is patient's PCP. Last visit was 11/03/2020.  Past Medical History:  Diagnosis Date   Arthritis    hands   Diabetes mellitus without complication ([place])    type 2 diet controlled   PONV (postoperative nausea and vomiting)    nausea only   Patient Active Problem List   Diagnosis Date Noted   Chronic kidney disease, stage 3 unspecified ([place]) 04/29/2020   Dyslipidemia 04/29/2020   Gastroesophageal reflux disease with esophagitis 04/29/2020   Leaking of urine 04/29/2020   Neuropathy due to type 2 diabetes mellitus ([place]) 04/29/2020   Obesity with body mass index 30 or greater 04/29/2020   Polyneuropathy 04/29/2020   Recurrent major depression in remission ([place]) 04/29/2020   Vitamin D deficiency 09/23/2019   Generalized anxiety disorder 09/23/2019   Class 1 drug-induced obesity without serious comorbidity with body mass index (BMI) of 32.0 to 32.9 in adult 09/17/2019   Senile nuclear sclerosis 10/31/2017   Hypercholesteremia 01/12/2017   Type 2 diabetes mellitus without complication, without long-term current use of insulin ([place]) 01/12/2017   Past Surgical History:  Procedure Laterality Date   APPENDECTOMY     bunions remoned from feet Bilateral few yrs ago   COLONOSCOPY WITH PROPOFOL N/A 08/17/2016   Procedure: COLONOSCOPY WITH PROPOFOL;  Surgeon: Garlan Fair, MD;  Location: [facility];  Service: Endoscopy;  Laterality: N/A;   colonscopy     x 2   LEEP  yrs ago   [place]   lower   TUBAL LIGATION     Current Outpatient Medications on File Prior  to Visit  Medication Sig Dispense Refill   atorvastatin (LIPITOR) 20 MG tablet Take 1 tablet by mouth daily.     buPROPion (WELLBUTRIN XL) 300 MG 24 hr tablet Take 300 mg by mouth daily.      Dulaglutide (TRULICITY) 8.75 IE/3.3IR SOPN Inject 0.5 mLs into the skin once a week.     glucose blood (ONETOUCH ULTRA) test strip 1 strip by Miscellaneous route daily.     levothyroxine (SYNTHROID) 50 MCG tablet Take by mouth.     OneTouch Delica Lancets 51O MISC 1 Stick by Miscellaneous route daily.     Besifloxacin HCl 0.6 % SUSP 1 drop 3 (three) times a day.     beta carotene w/minerals (OCUVITE) tablet Take 1 tablet by mouth daily.     Blood Glucose Monitoring Suppl (GLUCOCOM BLOOD GLUCOSE MONITOR) DEVI Use as directed to check sugars     Bromfenac Sodium 0.07 % SOLN Place 1 drop into the right eye daily.     cholecalciferol (VITAMIN D) 1000 units tablet Take by mouth.     ciprofloxacin (CIPRO) 500 MG tablet Take 500 mg by mouth 2 (two) times daily.     Difluprednate 0.05 % EMUL 1 drop 4 (four) times daily.     ibuprofen (ADVIL,MOTRIN) 200 MG tablet Take 200-400 mg by mouth daily as needed for headache or moderate pain.     meloxicam (MOBIC) 15 MG tablet TAKE 1 TABLET BY MOUTH DAILY     metFORMIN (GLUCOPHAGE) 500  MG tablet TK 1 T PO 1 TIME IN THE EVE WAC     metFORMIN (GLUCOPHAGE-XR) 500 MG 24 hr tablet TK 1 T PO QD     MULTIPLE VITAMIN PO Take by mouth.     Polyethyl Glycol-Propyl Glycol (SYSTANE OP) Apply 1 drop to eye daily as needed (dry eyes).     Vitamin D, Ergocalciferol, (DRISDOL) 1.25 MG (50000 UT) CAPS capsule Take 50,000 Units by mouth once a week.     No current facility-administered medications on file prior to visit.    Allergies  Allergen Reactions   Erythromycin     unknown   Demerol [Meperidine]     unknowm   Social History   Occupational History   Not on file  Tobacco Use   Smoking status: Former   Smokeless tobacco: Never  Vaping Use   Vaping Use: Never used   Substance and Sexual Activity   Alcohol use: Yes    Comment: social   Drug use: No    Comment: social smoker quit 2010   Sexual activity: Not on file   No family history on file. Immunization History  Administered Date(s) Administered   PFIZER(Purple Top)SARS-COV-2 Vaccination 05/18/2019, 06/12/2019     Review of Systems: Negative except as noted in the HPI.  Objective: There were no vitals filed for this visit.  Debra Norris is a pleasant 70 y.o. female in NAD. AAO X 3.  Vascular Examination: Capillary refill time to digits immediate b/l. Palpable pedal pulses b/l LE. Pedal hair present. Lower extremity skin temperature gradient within normal limits. No pain with calf compression b/l. No edema noted b/l lower extremities.  Dermatological Examination: Pedal skin with normal turgor, texture and tone b/l lower extremities. No open wounds b/l lower extremities. No interdigital macerations b/l lower extremities. Toenails 1-5 bilaterally well maintained with adequate length. No erythema, no edema, no drainage, no fluctuance.  Musculoskeletal Examination: Normal muscle strength 5/5 to all lower extremity muscle groups bilaterally. Hallux valgus with bunion deformity noted b/l lower extremities.  Footwear Assessment: Does the patient wear appropriate shoes? Yes. Does the patient need inserts/orthotics? No.  Neurological Examination: Protective sensation intact 5/5 intact bilaterally with 10g monofilament b/l. Vibratory sensation intact b/l.  Assessment: 1. Controlled type 2 diabetes mellitus without complication, without long-term current use of insulin ([facility])   2. Hallux valgus, acquired, bilateral   3. Encounter for diabetic foot exam ([place])      ADA Risk Categorization: Low Risk :  Patient has all of the following: Intact protective sensation No prior foot ulcer  No severe deformity Pedal pulses present  Plan: -Examined patient. -Diabetic foot examination performed  today. -Continue diabetic foot care principles: inspect feet daily, monitor glucose as recommended by PCP and/or Endocrinologist, and follow prescribed diet per PCP, Endocrinologist and/or dietician. -Patient to continue soft, supportive shoe gear daily. -Patient to report any pedal injuries to medical professional immediately. -Patient/POA to call should there be question/concern in the interim.  Return in about 1 year (around 12/16/2021).  Debra Norris, DPM

## 2021-12-07 DIAGNOSIS — E039 Hypothyroidism, unspecified: Secondary | ICD-10-CM | POA: Insufficient documentation

## 2021-12-15 ENCOUNTER — Encounter: Payer: Self-pay | Admitting: Podiatry

## 2021-12-15 ENCOUNTER — Ambulatory Visit (INDEPENDENT_AMBULATORY_CARE_PROVIDER_SITE_OTHER): Payer: Medicare Other | Admitting: Podiatry

## 2021-12-15 DIAGNOSIS — M2012 Hallux valgus (acquired), left foot: Secondary | ICD-10-CM | POA: Diagnosis not present

## 2021-12-15 DIAGNOSIS — M2011 Hallux valgus (acquired), right foot: Secondary | ICD-10-CM

## 2021-12-15 DIAGNOSIS — E119 Type 2 diabetes mellitus without complications: Secondary | ICD-10-CM | POA: Diagnosis not present

## 2021-12-15 DIAGNOSIS — M9261 Juvenile osteochondrosis of tarsus, right ankle: Secondary | ICD-10-CM

## 2021-12-19 NOTE — Progress Notes (Signed)
ANNUAL DIABETIC FOOT EXAM  Subjective: Debra Norris presents today for annual diabetic foot examination.  Debra Norris confirms h/o diabetes.  Debra Norris relates 9 year h/o diabetes.  Debra Norris denies any h/o foot wounds.   Debra Norris admits symptoms of foot tingling on occasion.  Debra Norris's blood sugar was 122 mg/dl today. Last known  HgA1c was 6.9%.  Debra Norris relates growth on the back of Debra Norris right heel which becomes tender from time to time. Debra Norris would like to know what it is and what treatment is available.  Risk factors: diabetes, diabetic neuropathy, CKD, dyslipidemia, h/o tobacco use in remission, foot deformity.  [name]., MD is Debra Norris's PCP. Last visit was December 04, 2021.  Past Medical History:  Diagnosis Date   Arthritis    hands   Diabetes mellitus without complication ([place])    type 2 diet controlled   PONV (postoperative nausea and vomiting)    nausea only   Debra Norris Active Problem List   Diagnosis Date Noted   Chronic kidney disease, stage 3 unspecified ([place]) 04/29/2020   Dyslipidemia 04/29/2020   Gastroesophageal reflux disease with esophagitis 04/29/2020   Leaking of urine 04/29/2020   Neuropathy due to type 2 diabetes mellitus ([place]) 04/29/2020   Obesity with body mass index 30 or greater 04/29/2020   Polyneuropathy 04/29/2020   Recurrent major depression in remission ([place]) 04/29/2020   Vitamin D deficiency 09/23/2019   Generalized anxiety disorder 09/23/2019   Class 1 drug-induced obesity without serious comorbidity with body mass index (BMI) of 32.0 to 32.9 in adult 09/17/2019   Senile nuclear sclerosis 10/31/2017   Hypercholesteremia 01/12/2017   Type 2 diabetes mellitus without complication, without long-term current use of insulin ([place]) 01/12/2017   Past Surgical History:  Procedure Laterality Date   APPENDECTOMY     bunions remoned from feet Bilateral few yrs ago   COLONOSCOPY WITH PROPOFOL N/A 08/17/2016   Procedure: COLONOSCOPY WITH PROPOFOL;   Surgeon: Garlan Fair, MD;  Location: [facility];  Service: Endoscopy;  Laterality: N/A;   colonscopy     x 2   LEEP  yrs ago   [place]   lower   TUBAL LIGATION     Current Outpatient Medications on File Prior to Visit  Medication Sig Dispense Refill   atorvastatin (LIPITOR) 20 MG tablet Take 1 tablet by mouth daily.     levothyroxine (SYNTHROID) 50 MCG tablet Take by mouth.     metFORMIN (GLUCOPHAGE-XR) 500 MG 24 hr tablet Take 1 tablet by mouth daily.     beta carotene w/minerals (OCUVITE) tablet Take 1 tablet by mouth daily.     Blood Glucose Monitoring Suppl (GLUCOCOM BLOOD GLUCOSE MONITOR) DEVI Use as directed to check sugars     Bromfenac Sodium 0.07 % SOLN Place 1 drop into the right eye daily.     buPROPion (WELLBUTRIN XL) 300 MG 24 hr tablet Take 300 mg by mouth daily.      cholecalciferol (VITAMIN D) 1000 units tablet Take by mouth.     ciprofloxacin (CIPRO) 500 MG tablet Take 500 mg by mouth 2 (two) times daily.     Dulaglutide (TRULICITY) 8.18 EX/9.3ZJ SOPN Inject 0.5 mLs into the skin once a week.     ibuprofen (ADVIL,MOTRIN) 200 MG tablet Take 200-400 mg by mouth daily as needed for headache or moderate pain.     meloxicam (MOBIC) 15 MG tablet TAKE 1 TABLET BY MOUTH DAILY     metFORMIN (GLUCOPHAGE) 500 MG tablet TK 1  T PO 1 TIME IN THE EVE WAC     MULTIPLE VITAMIN PO Take by mouth.     nitrofurantoin, macrocrystal-monohydrate, (MACROBID) 100 MG capsule Take 100 mg by mouth 2 (two) times daily.     Polyethyl Glycol-Propyl Glycol (SYSTANE OP) Apply 1 drop to eye daily as needed (dry eyes).     Vitamin D, Ergocalciferol, (DRISDOL) 1.25 MG (50000 UT) CAPS capsule Take 50,000 Units by mouth once a week.     No current facility-administered medications on file prior to visit.    Allergies  Allergen Reactions   Erythromycin     unknown   Demerol [Meperidine]     unknowm   Social History   Occupational History   Not on file  Tobacco Use   Smoking  status: Former   Smokeless tobacco: Never  Vaping Use   Vaping Use: Never used  Substance and Sexual Activity   Alcohol use: Yes    Comment: social   Drug use: No    Comment: social smoker quit 2010   Sexual activity: Not on file   History reviewed. No pertinent family history. Immunization History  Administered Date(s) Administered   PFIZER Comirnaty(Gray Top)Covid-19 Tri-Sucrose Vaccine 05/18/2019, 06/12/2019   PFIZER(Purple Top)SARS-COV-2 Vaccination 05/18/2019, 06/12/2019     Review of Systems: Negative except as noted in the HPI.   Objective: There were no vitals filed for this visit.  KIANNAH GRUNOW is a pleasant 71 y.o. female in NAD. AAO X 3.  Vascular Examination: Vascular status intact b/l with palpable pedal pulses. Pedal hair present b/l. CFT immediate b/l. No edema. No pain with calf compression b/l. Skin temperature gradient WNL b/l.   Neurological Examination: Sensation grossly intact b/l with 10 gram monofilament. Vibratory sensation intact b/l. Debra Norris has subjective symptoms of neuropathy.  Dermatological Examination: Pedal skin is warm and supple b/l LE. No open wounds b/l LE. No interdigital macerations noted b/l LE. Toenails 1-5 b/l well maintained with adequate length. No erythema, no edema, no drainage, no fluctuance. No hyperkeratotic nor porokeratotic lesions present on today's visit.  Musculoskeletal Examination: Muscle strength 5/5 to b/l LE. Normal muscle strength 5/5 to all lower extremity muscle groups bilaterally. Palpable exostosis noted posterior aspect right calcaneus at insertion of Achilles tendon. No edema, no warmth.  Hammerote right 2nd digit. Debra Norris ambulates independently without assistive aids.  Radiographs: None  Footwear Assessment: Does the Debra Norris wear appropriate shoes? Yes. Does the Debra Norris need inserts/orthotics? Yes.  ADA Risk Categorization: Low Risk :  Debra Norris has all of the following: Intact protective sensation No  prior foot ulcer  No severe deformity Pedal pulses present  Assessment: 1. Hallux valgus, acquired, bilateral   2. Haglund's deformity of right heel   3. Controlled type 2 diabetes mellitus without complication, without long-term current use of insulin ([place])   4. Encounter for diabetic foot exam [facility])     Plan: -Debra Norris was evaluated and treated. All Debra Norris's and/or POA's questions/concerns answered on today's visit. -Diabetic foot examination performed today. -Continue foot and shoe inspections daily. Monitor blood glucose per PCP/Endocrinologist's recommendations. -Debra Norris to continue soft, supportive shoe gear daily. -Debra Norris referred to Dr. Lanae Crumbly for evaluation of Haglund's deformity RLE. -Debra Norris/POA to call should there be question/concern in the interim. Return in about 1 year (around 12/16/2022).  Marzetta Board, DPM

## 2021-12-22 ENCOUNTER — Ambulatory Visit: Payer: Medicare Other | Admitting: Podiatry

## 2022-01-11 ENCOUNTER — Ambulatory Visit (INDEPENDENT_AMBULATORY_CARE_PROVIDER_SITE_OTHER): Payer: Medicare Other | Admitting: Podiatry

## 2022-01-11 ENCOUNTER — Ambulatory Visit (INDEPENDENT_AMBULATORY_CARE_PROVIDER_SITE_OTHER): Payer: Medicare Other

## 2022-01-11 DIAGNOSIS — M62461 Contracture of muscle, right lower leg: Secondary | ICD-10-CM

## 2022-01-11 DIAGNOSIS — M6788 Other specified disorders of synovium and tendon, other site: Secondary | ICD-10-CM | POA: Diagnosis not present

## 2022-01-11 DIAGNOSIS — M9261 Juvenile osteochondrosis of tarsus, right ankle: Secondary | ICD-10-CM | POA: Diagnosis not present

## 2022-01-11 MED ORDER — DICLOFENAC SODIUM 1 % EX GEL: 4.0000 g | Freq: Four times a day (QID) | CUTANEOUS | 4 refills | Status: AC

## 2022-01-11 NOTE — Progress Notes (Signed)
  Subjective:  Patient ID: Debra Norris, female    DOB: 1950/08/31,  MRN: [id number]  Chief Complaint  Patient presents with   Foot Pain    Pt states she is having pain in her right foot , around the ankle area .     71 y.o. female presents with the above complaint. History confirmed with patient.  Pain is been going on for some time is in the posterior heel  Objective:  Physical Exam: warm, good capillary refill, no trophic changes or ulcerative lesions, normal DP and PT pulses, normal sensory exam, and fullness and tenderness in the distal right Achilles tendon, mild in nature, palpable bony spurring   Radiographs: Multiple views x-ray of the right foot: no fracture, dislocation, swelling or degenerative changes noted, plantar calcaneal spur, posterior calcaneal spur, and Haglund deformity noted Assessment:   1. Achilles tendinosis of right lower extremity   2. Acquired Haglund's deformity of right heel   3. Gastrocnemius equinus of right lower extremity      Plan:  Patient was evaluated and treated and all questions answered.  Discussed the etiology and treatment options for Achilles tendinitis including stretching, formal physical therapy with an eccentric exercises therapy plan, supportive shoegears such as a running shoe or sneaker, heel lifts, topical and oral medications.  We also discussed that I do not routinely perform injections in this area because of the risk of an increased damage or rupture of the tendon.  We also discussed the role of surgical treatment of this for patients who do not improve after exhausting non-surgical treatment options.  -XR reviewed with patient -Educated on stretching and icing of the affected limb. -Recommended topical anti-inflammatory, Rx for diclofenac gel sent to pharmacy  Return if symptoms worsen or fail to improve.

## 2022-01-11 NOTE — Patient Instructions (Signed)

## 2022-12-16 ENCOUNTER — Ambulatory Visit: Payer: Medicare Other | Admitting: Podiatry

## 2022-12-28 ENCOUNTER — Ambulatory Visit (INDEPENDENT_AMBULATORY_CARE_PROVIDER_SITE_OTHER): Payer: Medicare HMO | Admitting: Podiatry

## 2022-12-28 DIAGNOSIS — M9261 Juvenile osteochondrosis of tarsus, right ankle: Secondary | ICD-10-CM

## 2022-12-28 DIAGNOSIS — M2041 Other hammer toe(s) (acquired), right foot: Secondary | ICD-10-CM | POA: Diagnosis not present

## 2022-12-28 DIAGNOSIS — M2042 Other hammer toe(s) (acquired), left foot: Secondary | ICD-10-CM

## 2022-12-28 DIAGNOSIS — M2012 Hallux valgus (acquired), left foot: Secondary | ICD-10-CM

## 2022-12-28 DIAGNOSIS — R200 Anesthesia of skin: Secondary | ICD-10-CM | POA: Diagnosis not present

## 2022-12-28 DIAGNOSIS — M2011 Hallux valgus (acquired), right foot: Secondary | ICD-10-CM

## 2022-12-28 DIAGNOSIS — E119 Type 2 diabetes mellitus without complications: Secondary | ICD-10-CM

## 2023-01-01 ENCOUNTER — Encounter: Payer: Self-pay | Admitting: Podiatry

## 2023-01-01 DIAGNOSIS — F329 Major depressive disorder, single episode, unspecified: Secondary | ICD-10-CM | POA: Insufficient documentation

## 2023-01-01 NOTE — Progress Notes (Signed)
ANNUAL DIABETIC FOOT EXAM  Subjective: Debra Norris presents today annual diabetic foot exam. She relates slight numbness over digits 1-3 of left foot on occasion.  Chief Complaint  Patient presents with   Diabetes    [facility] BS-113 A1C-6.2 PCPV-05/2022   Patient confirms h/o diabetes.  Patient denies any h/o foot wounds.  Risk factors: diabetes, CKD, hypercholesterolemia, h/o tobacco use in remission.  Laqueta Due., MD is patient's PCP.  Past Medical History:  Diagnosis Date   Arthritis    hands   Diabetes mellitus without complication ([facility])    type 2 diet controlled   PONV (postoperative nausea and vomiting)    nausea only   Patient Active Problem List   Diagnosis Date Noted   Major depression 01/01/2023   Acquired hypothyroidism 12/07/2021   Chronic kidney disease, stage 3 unspecified ([facility]) 04/29/2020   Dyslipidemia 04/29/2020   Gastroesophageal reflux disease with esophagitis 04/29/2020   Leaking of urine 04/29/2020   Neuropathy due to type 2 diabetes mellitus ([facility]) 04/29/2020   Obesity with body mass index 30 or greater 04/29/2020   Polyneuropathy 04/29/2020   Recurrent major depression in remission ([facility]) 04/29/2020   Vitamin D deficiency 09/23/2019   Generalized anxiety disorder 09/23/2019   Class 1 drug-induced obesity without serious comorbidity with body mass index (BMI) of 32.0 to 32.9 in adult 09/17/2019   Senile nuclear sclerosis 10/31/2017   Hypercholesteremia 01/12/2017   Type 2 diabetes mellitus without complication, without long-term current use of insulin ([facility]) 01/12/2017   Past Surgical History:  Procedure Laterality Date   APPENDECTOMY     bunions remoned from feet Bilateral few yrs ago   COLONOSCOPY WITH PROPOFOL N/A 08/17/2016   Procedure: COLONOSCOPY WITH PROPOFOL;  Surgeon: Charolett Bumpers, MD;  Location: [facility];  Service: Endoscopy;  Laterality: N/A;   colonscopy     x 2   LEEP  yrs ago   SPINE SURGERY  1995   lower   TUBAL  LIGATION     Current Outpatient Medications on File Prior to Visit  Medication Sig Dispense Refill   beta carotene w/minerals (OCUVITE) tablet Take 1 tablet by mouth daily.     Blood Glucose Monitoring Suppl (GLUCOCOM BLOOD GLUCOSE MONITOR) DEVI Use as directed to check sugars     Bromfenac Sodium 0.07 % SOLN Place 1 drop into the right eye daily.     buPROPion (WELLBUTRIN XL) 300 MG 24 hr tablet Take 300 mg by mouth daily.      cholecalciferol (VITAMIN D) 1000 units tablet Take by mouth.     diclofenac Sodium (VOLTAREN) 1 % GEL Apply 4 g topically 4 (four) times daily. 100 g 4   Dulaglutide (TRULICITY) 0.75 MG/0.5ML SOPN Inject 0.5 mLs into the skin once a week.     Lancets (ONETOUCH DELICA PLUS LANCET33G) MISC Apply topically daily.     metFORMIN (GLUCOPHAGE) 500 MG tablet TK 1 T PO 1 TIME IN THE EVE WAC     nitrofurantoin, macrocrystal-monohydrate, (MACROBID) 100 MG capsule Take 100 mg by mouth 2 (two) times daily.     ONETOUCH ULTRA test strip TEST DAILY AS DIRECTED     Polyethyl Glycol-Propyl Glycol (SYSTANE OP) Apply 1 drop to eye daily as needed (dry eyes).     Vitamin D, Ergocalciferol, (DRISDOL) 1.25 MG (50000 UT) CAPS capsule Take 50,000 Units by mouth once a week.     No current facility-administered medications on file prior to visit.    Allergies  Allergen Reactions  Erythromycin     unknown   Demerol [Meperidine]     unknowm   Social History   Occupational History   Not on file  Tobacco Use   Smoking status: Former   Smokeless tobacco: Never  Vaping Use   Vaping status: Never Used  Substance and Sexual Activity   Alcohol use: Yes    Comment: social   Drug use: No    Comment: social smoker quit 2010   Sexual activity: Not on file   History reviewed. No pertinent family history. Immunization History  Administered Date(s) Administered   PFIZER(Purple Top)SARS-COV-2 Vaccination 05/18/2019, 06/12/2019     Review of Systems: Negative except as noted in the  HPI.   Objective: There were no vitals filed for this visit.  Debra Norris is a pleasant 72 y.o. female in NAD. AAO X 3.  Vascular Examination: Capillary refill time immediate b/l. Vascular status intact b/l with palpable pedal pulses. Pedal hair present b/l. No pain with calf compression b/l. Skin temperature gradient WNL b/l. No cyanosis or clubbing b/l. No ischemia or gangrene noted b/l.   Neurological Examination: Sensation grossly intact b/l with 10 gram monofilament. Vibratory sensation intact b/l. Pt has subjective symptoms of neuropathy.  Dermatological Examination: Pedal skin with normal turgor, texture and tone b/l.  No open wounds. No interdigital macerations.   Toenails 1-5 b/l thick, discolored, elongated with subungual debris and pain on dorsal palpation.   No corns, calluses nor porokeratotic lesions noted.  Musculoskeletal Examination: Muscle strength 5/5 to all lower extremity muscle groups bilaterally. Palpable exostosis insertion of Achilles right calcaneus. HAV with bunion deformity noted b/l LE. Hammertoe(s) noted to the R 2nd toe.  Radiographs: None  Last A1c:       No data to display          ADA Risk Categorization: Low Risk :  Patient has all of the following: Intact protective sensation No prior foot ulcer  No severe deformity Pedal pulses present  Assessment: 1. Hallux valgus, acquired, bilateral   2. Acquired hammertoes of both feet   3. Haglund's deformity of right heel   4. Numbness of left foot   5. Controlled type 2 diabetes mellitus without complication, without long-term current use of insulin (HCC)   6. Encounter for diabetic foot exam ([facility])     Plan: -Consent given for treatment as described below: -Examined patient. -Discussed neuropathy symptoms. Advised patient/POA to purchase Nervive Pain Cream or Roll On. Apply to foot/feet before bedtime. -Diabetic foot examination performed today. -Continue diabetic foot care  principles: inspect feet daily, monitor glucose as recommended by PCP and/or Endocrinologist, and follow prescribed diet per PCP, Endocrinologist and/or dietician. -Patient/POA to call should there be question/concern in the interim. Return in about 1 year (around 12/28/2023).  Freddie Breech, DPM

## 2023-05-09 ENCOUNTER — Other Ambulatory Visit (HOSPITAL_COMMUNITY): Payer: Self-pay

## 2023-05-09 MED ORDER — LEVOTHYROXINE SODIUM 50 MCG PO TABS
50.0000 ug | ORAL_TABLET | Freq: Every morning | ORAL | 1 refills | Status: AC
  Filled 2023-05-09: qty 90, 90d supply, fill #0
  Filled 2023-09-21: qty 90, 90d supply, fill #1

## 2023-05-09 MED ORDER — METFORMIN HCL ER 500 MG PO TB24: 500.0000 mg | ORAL_TABLET | Freq: Every day | ORAL | 3 refills | Status: AC

## 2023-05-09 MED ORDER — GLUCOSE BLOOD VI STRP
1.0000 | ORAL_STRIP | Freq: Every day | 3 refills | Status: AC
  Filled 2023-07-30: qty 100, 100d supply, fill #0
  Filled 2023-11-01: qty 100, 100d supply, fill #1

## 2023-05-09 MED ORDER — BUPROPION HCL ER (XL) 300 MG PO TB24
300.0000 mg | ORAL_TABLET | Freq: Every morning | ORAL | 3 refills | Status: AC
  Filled 2023-09-21: qty 90, 90d supply, fill #0

## 2023-05-09 MED ORDER — ACCU-CHEK SOFTCLIX LANCETS MISC
1.0000 | Freq: Every day | 3 refills | Status: AC
  Filled 2023-09-21 – 2023-09-22 (×2): qty 100, 100d supply, fill #0

## 2023-05-10 ENCOUNTER — Other Ambulatory Visit (HOSPITAL_COMMUNITY): Payer: Self-pay

## 2023-05-10 ENCOUNTER — Other Ambulatory Visit: Payer: Self-pay

## 2023-05-16 ENCOUNTER — Other Ambulatory Visit: Payer: Self-pay

## 2023-05-16 ENCOUNTER — Other Ambulatory Visit (HOSPITAL_COMMUNITY): Payer: Self-pay

## 2023-05-16 MED ORDER — LANCETS MISC
3 refills | Status: AC
Start: 2023-05-16 — End: ?
  Filled 2023-05-16 – 2023-09-21 (×2): qty 100, 100d supply, fill #0
  Filled 2023-12-23 – 2024-04-09 (×14): qty 100, 100d supply, fill #1

## 2023-05-16 MED ORDER — TRULICITY 0.75 MG/0.5ML ~~LOC~~ SOAJ
0.7500 mg | SUBCUTANEOUS | 3 refills | Status: AC
  Filled 2023-05-16: qty 4, 56d supply, fill #0
  Filled 2023-08-05 – 2023-08-09 (×2): qty 4, 56d supply, fill #1
  Filled 2023-08-09: qty 2, 28d supply, fill #1
  Filled 2023-09-04: qty 4, 56d supply, fill #1
  Filled 2023-10-23: qty 4, 56d supply, fill #2
  Filled 2023-12-18: qty 4, 56d supply, fill #3
  Filled 2024-02-12: qty 4, 56d supply, fill #4
  Filled 2024-04-01: qty 4, 56d supply, fill #5

## 2023-05-16 MED ORDER — ACCU-CHEK GUIDE TEST VI STRP
ORAL_STRIP | 3 refills | Status: DC
Start: 2023-05-16 — End: 2023-05-19
  Filled 2023-05-16: qty 100, 100d supply, fill #0

## 2023-05-16 MED ORDER — ATORVASTATIN CALCIUM 20 MG PO TABS
20.0000 mg | ORAL_TABLET | Freq: Every day | ORAL | 3 refills | Status: AC
  Filled 2023-05-16: qty 90, 90d supply, fill #0
  Filled 2023-09-21: qty 90, 90d supply, fill #1
  Filled 2023-12-13: qty 90, 90d supply, fill #2
  Filled 2024-03-12: qty 90, 90d supply, fill #3

## 2023-05-16 MED ORDER — BUPROPION HCL ER (XL) 300 MG PO TB24
ORAL_TABLET | ORAL | 3 refills | Status: AC
  Filled 2023-05-16: qty 90, 90d supply, fill #0
  Filled 2023-09-21: qty 90, 90d supply, fill #1

## 2023-05-17 ENCOUNTER — Other Ambulatory Visit: Payer: Self-pay

## 2023-05-18 ENCOUNTER — Other Ambulatory Visit: Payer: Self-pay

## 2023-05-18 ENCOUNTER — Other Ambulatory Visit (HOSPITAL_COMMUNITY): Payer: Self-pay

## 2023-05-19 ENCOUNTER — Other Ambulatory Visit (HOSPITAL_COMMUNITY): Payer: Self-pay

## 2023-05-20 ENCOUNTER — Other Ambulatory Visit (HOSPITAL_COMMUNITY): Payer: Self-pay

## 2023-05-20 MED ORDER — PRECISION QID TEST VI STRP
ORAL_STRIP | 0 refills | Status: DC
Start: 2023-05-19 — End: 2023-07-05
  Filled 2023-05-20: qty 100, 30d supply, fill #0

## 2023-05-20 MED ORDER — TRUE METRIX METER W/DEVICE KIT
PACK | 0 refills | Status: AC
  Filled 2023-05-20: qty 1, 30d supply, fill #0

## 2023-05-22 ENCOUNTER — Other Ambulatory Visit: Payer: Self-pay

## 2023-06-27 ENCOUNTER — Other Ambulatory Visit (HOSPITAL_COMMUNITY): Payer: Self-pay

## 2023-06-27 MED ORDER — BUPROPION HCL ER (XL) 300 MG PO TB24
300.0000 mg | ORAL_TABLET | Freq: Every morning | ORAL | 3 refills | Status: AC
  Filled 2023-06-27 – 2023-07-30 (×2): qty 90, 90d supply, fill #0
  Filled 2023-10-23: qty 90, 90d supply, fill #1
  Filled 2024-01-20: qty 90, 90d supply, fill #2
  Filled 2024-04-19: qty 90, 90d supply, fill #3

## 2023-06-27 MED ORDER — METFORMIN HCL ER 500 MG PO TB24
500.0000 mg | ORAL_TABLET | Freq: Every day | ORAL | 3 refills | Status: AC
  Filled 2023-06-27: qty 90, 90d supply, fill #0
  Filled 2023-09-19: qty 90, 90d supply, fill #1
  Filled 2023-12-17: qty 90, 90d supply, fill #2
  Filled 2024-03-16: qty 90, 90d supply, fill #3

## 2023-06-27 MED ORDER — TRULICITY 0.75 MG/0.5ML ~~LOC~~ SOAJ
0.7500 mg | SUBCUTANEOUS | 3 refills | Status: DC
  Filled 2023-06-27 – 2023-08-05 (×3): qty 6, 84d supply, fill #0
  Filled 2023-08-05: qty 2, 28d supply, fill #0

## 2023-06-27 MED ORDER — LEVOTHYROXINE SODIUM 50 MCG PO TABS
50.0000 ug | ORAL_TABLET | Freq: Every morning | ORAL | 1 refills | Status: AC
  Filled 2023-06-27 – 2023-07-30 (×2): qty 90, 90d supply, fill #0
  Filled 2023-10-23: qty 90, 90d supply, fill #1

## 2023-06-27 MED ORDER — VITAMIN D (ERGOCALCIFEROL) 1.25 MG (50000 UNIT) PO CAPS
50000.0000 [IU] | ORAL_CAPSULE | ORAL | 1 refills | Status: DC
  Filled 2023-06-27: qty 12, 84d supply, fill #0
  Filled 2023-09-11 – 2023-09-14 (×2): qty 12, 84d supply, fill #1

## 2023-06-27 MED ORDER — ATORVASTATIN CALCIUM 20 MG PO TABS
20.0000 mg | ORAL_TABLET | Freq: Every day | ORAL | 3 refills | Status: AC
  Filled 2023-06-27: qty 90, 90d supply, fill #0

## 2023-06-29 ENCOUNTER — Other Ambulatory Visit (HOSPITAL_COMMUNITY): Payer: Self-pay

## 2023-07-05 ENCOUNTER — Other Ambulatory Visit: Payer: Self-pay

## 2023-07-05 ENCOUNTER — Other Ambulatory Visit (HOSPITAL_COMMUNITY): Payer: Self-pay

## 2023-07-05 MED ORDER — ONETOUCH DELICA PLUS LANCET33G MISC
Freq: Every day | 3 refills | Status: AC
  Filled 2023-07-05: qty 100, 100d supply, fill #0
  Filled 2023-07-30 – 2023-12-05 (×11): qty 100, 100d supply, fill #1
  Filled 2024-03-07: qty 100, 100d supply, fill #2

## 2023-07-31 ENCOUNTER — Other Ambulatory Visit (HOSPITAL_COMMUNITY): Payer: Self-pay

## 2023-07-31 ENCOUNTER — Other Ambulatory Visit: Payer: Self-pay

## 2023-08-05 ENCOUNTER — Other Ambulatory Visit (HOSPITAL_COMMUNITY): Payer: Self-pay

## 2023-08-09 ENCOUNTER — Other Ambulatory Visit (HOSPITAL_COMMUNITY): Payer: Self-pay

## 2023-08-17 ENCOUNTER — Other Ambulatory Visit (HOSPITAL_COMMUNITY): Payer: Self-pay

## 2023-09-04 ENCOUNTER — Other Ambulatory Visit (HOSPITAL_COMMUNITY): Payer: Self-pay

## 2023-09-11 ENCOUNTER — Other Ambulatory Visit (HOSPITAL_COMMUNITY): Payer: Self-pay

## 2023-09-14 ENCOUNTER — Other Ambulatory Visit: Payer: Self-pay

## 2023-09-14 ENCOUNTER — Other Ambulatory Visit (HOSPITAL_COMMUNITY): Payer: Self-pay

## 2023-09-19 ENCOUNTER — Other Ambulatory Visit (HOSPITAL_COMMUNITY): Payer: Self-pay

## 2023-09-20 ENCOUNTER — Other Ambulatory Visit (HOSPITAL_COMMUNITY): Payer: Self-pay

## 2023-09-21 ENCOUNTER — Other Ambulatory Visit (HOSPITAL_COMMUNITY): Payer: Self-pay

## 2023-09-21 ENCOUNTER — Other Ambulatory Visit: Payer: Self-pay

## 2023-09-22 ENCOUNTER — Other Ambulatory Visit (HOSPITAL_COMMUNITY): Payer: Self-pay

## 2023-09-22 ENCOUNTER — Encounter: Payer: Self-pay | Admitting: Pharmacist

## 2023-09-22 ENCOUNTER — Other Ambulatory Visit: Payer: Self-pay

## 2023-09-27 ENCOUNTER — Other Ambulatory Visit: Payer: Self-pay

## 2023-10-10 ENCOUNTER — Other Ambulatory Visit (HOSPITAL_COMMUNITY): Payer: Self-pay

## 2023-10-11 ENCOUNTER — Other Ambulatory Visit (HOSPITAL_COMMUNITY): Payer: Self-pay

## 2023-10-12 ENCOUNTER — Other Ambulatory Visit (HOSPITAL_COMMUNITY): Payer: Self-pay

## 2023-10-13 ENCOUNTER — Other Ambulatory Visit (HOSPITAL_COMMUNITY): Payer: Self-pay

## 2023-10-14 ENCOUNTER — Other Ambulatory Visit (HOSPITAL_COMMUNITY): Payer: Self-pay

## 2023-10-16 ENCOUNTER — Other Ambulatory Visit (HOSPITAL_COMMUNITY): Payer: Self-pay

## 2023-10-17 ENCOUNTER — Other Ambulatory Visit (HOSPITAL_COMMUNITY): Payer: Self-pay

## 2023-10-18 ENCOUNTER — Other Ambulatory Visit: Payer: Self-pay

## 2023-10-23 ENCOUNTER — Other Ambulatory Visit: Payer: Self-pay

## 2023-11-01 ENCOUNTER — Other Ambulatory Visit: Payer: Self-pay

## 2023-11-20 ENCOUNTER — Other Ambulatory Visit (HOSPITAL_COMMUNITY): Payer: Self-pay

## 2023-11-20 ENCOUNTER — Other Ambulatory Visit: Payer: Self-pay

## 2023-11-20 MED ORDER — VITAMIN D (ERGOCALCIFEROL) 1.25 MG (50000 UNIT) PO CAPS
50000.0000 [IU] | ORAL_CAPSULE | ORAL | 1 refills | Status: AC
  Filled 2023-11-20: qty 12, 84d supply, fill #0

## 2023-11-28 ENCOUNTER — Other Ambulatory Visit (HOSPITAL_COMMUNITY): Payer: Self-pay

## 2023-12-02 ENCOUNTER — Other Ambulatory Visit (HOSPITAL_COMMUNITY): Payer: Self-pay

## 2023-12-27 ENCOUNTER — Other Ambulatory Visit (HOSPITAL_COMMUNITY): Payer: Self-pay

## 2023-12-28 ENCOUNTER — Other Ambulatory Visit (HOSPITAL_COMMUNITY): Payer: Self-pay

## 2024-01-04 ENCOUNTER — Other Ambulatory Visit (HOSPITAL_COMMUNITY): Payer: Self-pay

## 2024-01-05 ENCOUNTER — Other Ambulatory Visit (HOSPITAL_COMMUNITY): Payer: Self-pay

## 2024-01-08 ENCOUNTER — Other Ambulatory Visit (HOSPITAL_COMMUNITY): Payer: Self-pay

## 2024-01-09 ENCOUNTER — Other Ambulatory Visit (HOSPITAL_COMMUNITY): Payer: Self-pay

## 2024-01-10 ENCOUNTER — Other Ambulatory Visit (HOSPITAL_COMMUNITY): Payer: Self-pay

## 2024-01-11 ENCOUNTER — Other Ambulatory Visit (HOSPITAL_COMMUNITY): Payer: Self-pay

## 2024-01-12 ENCOUNTER — Other Ambulatory Visit (HOSPITAL_COMMUNITY): Payer: Self-pay

## 2024-01-18 ENCOUNTER — Other Ambulatory Visit (HOSPITAL_COMMUNITY): Payer: Self-pay

## 2024-01-19 ENCOUNTER — Other Ambulatory Visit (HOSPITAL_COMMUNITY): Payer: Self-pay

## 2024-01-19 MED ORDER — LEVOTHYROXINE SODIUM 50 MCG PO TABS
50.0000 ug | ORAL_TABLET | Freq: Every morning | ORAL | 1 refills | Status: AC
  Filled 2024-01-19: qty 90, 90d supply, fill #0
  Filled 2024-04-19: qty 90, 90d supply, fill #1

## 2024-01-22 ENCOUNTER — Other Ambulatory Visit: Payer: Self-pay

## 2024-01-24 ENCOUNTER — Other Ambulatory Visit (HOSPITAL_COMMUNITY): Payer: Self-pay

## 2024-01-31 ENCOUNTER — Other Ambulatory Visit (HOSPITAL_COMMUNITY): Payer: Self-pay

## 2024-01-31 MED ORDER — LEVOTHYROXINE SODIUM 50 MCG PO TABS: 50.0000 ug | ORAL_TABLET | Freq: Every morning | ORAL | 1 refills | Status: AC

## 2024-01-31 MED ORDER — METFORMIN HCL ER 500 MG PO TB24: 500.0000 mg | ORAL_TABLET | Freq: Every day | ORAL | 3 refills | Status: AC

## 2024-03-13 ENCOUNTER — Other Ambulatory Visit (HOSPITAL_COMMUNITY): Payer: Self-pay

## 2024-03-13 MED ORDER — VITAMIN D (ERGOCALCIFEROL) 1.25 MG (50000 UNIT) PO CAPS
50000.0000 [IU] | ORAL_CAPSULE | ORAL | 1 refills | Status: AC
  Filled 2024-03-13: qty 12, 84d supply, fill #0

## 2024-03-14 ENCOUNTER — Other Ambulatory Visit: Payer: Self-pay

## 2024-04-09 ENCOUNTER — Other Ambulatory Visit (HOSPITAL_COMMUNITY): Payer: Self-pay

## 2024-04-09 ENCOUNTER — Other Ambulatory Visit: Payer: Self-pay

## 2024-04-09 MED ORDER — ONETOUCH VERIO VI STRP
ORAL_STRIP | 3 refills | Status: AC
  Filled 2024-04-09: qty 100, 50d supply, fill #0

## 2024-04-14 ENCOUNTER — Other Ambulatory Visit (HOSPITAL_COMMUNITY): Payer: Self-pay

## 2024-04-14 MED ORDER — ACCU-CHEK GUIDE W/DEVICE KIT
PACK | 0 refills | Status: AC
  Filled 2024-04-14: qty 1, 30d supply, fill #0

## 2024-04-15 ENCOUNTER — Other Ambulatory Visit: Payer: Self-pay

## 2024-04-19 ENCOUNTER — Other Ambulatory Visit: Payer: Self-pay

## 2024-04-23 ENCOUNTER — Other Ambulatory Visit: Payer: Self-pay

## 2024-04-25 ENCOUNTER — Other Ambulatory Visit: Payer: Self-pay

## 2024-04-26 ENCOUNTER — Other Ambulatory Visit: Payer: Self-pay

## 2024-04-26 ENCOUNTER — Other Ambulatory Visit (HOSPITAL_COMMUNITY): Payer: Self-pay
